# Patient Record
Sex: Male | Born: 1935 | Race: Black or African American | Hispanic: No | Smoking: Former smoker
Health system: Southern US, Community
[De-identification: ages and names within clinical notes are randomized; demographics above are authoritative.]

## PROBLEM LIST (undated history)

## (undated) DIAGNOSIS — H409 Unspecified glaucoma: Secondary | ICD-10-CM

## (undated) DIAGNOSIS — I10 Essential (primary) hypertension: Secondary | ICD-10-CM

## (undated) DIAGNOSIS — Z9289 Personal history of other medical treatment: Secondary | ICD-10-CM

## (undated) DIAGNOSIS — H269 Unspecified cataract: Secondary | ICD-10-CM

## (undated) DIAGNOSIS — M109 Gout, unspecified: Secondary | ICD-10-CM

## (undated) DIAGNOSIS — E785 Hyperlipidemia, unspecified: Secondary | ICD-10-CM

## (undated) DIAGNOSIS — M1990 Unspecified osteoarthritis, unspecified site: Secondary | ICD-10-CM

## (undated) HISTORY — DX: Hyperlipidemia, unspecified: E78.5

## (undated) HISTORY — DX: Unspecified glaucoma: H40.9

## (undated) HISTORY — PX: POLYPECTOMY: SHX149

## (undated) HISTORY — PX: COLONOSCOPY W/ POLYPECTOMY: SHX1380

## (undated) HISTORY — PX: COLONOSCOPY: SHX174

## (undated) HISTORY — DX: Unspecified cataract: H26.9

## (undated) NOTE — *Deleted (*Deleted)
[facility] Discharge Summary  Patient name: Matthew Winters Medical record number: [id number] Date of birth: [birth year] Age: 40 y.o. Gender: male Date of Admission: 09/22/2020  Date of Discharge: 10/***/2021 Admitting Physician: Shary Key, DO  Primary Care Provider: [name], MD Consultants: Palliative Care, Nephrology  Indication for Hospitalization: AMS, Fall  Discharge Diagnoses/Problem List:  AMS s/p fall. Meeting SIRS criteria Acute kidney failure Hyperkalemia AG metabolic acidosis Acute Infarct   Disposition: To Hospice  Discharge Condition: ***  Discharge Exam: ***  Brief Hospital Course:  AMS s/p fall. Meeting SIRS criteria: On admission patient was hypothermic at < 93 and had a leukocytosis meeting SIRS criteria. At this point patient was anuric and so Nephrology was consulted and recommended giving fluids and antibiotics until family could have a goals of care discussion. He received 3.5 L IVF bolus and was placed on NS 125 cc/hr. On 10/10 was transitioned to full comfort care and all further workup was stopped.   Acute kidney failure: On admission baseline kidney function was unknown but had a Creatinine of 8.8, BUN 149, GFR of 5, and was anuric. Patient was a poor candidate for dialysis so was started on NS 125 cc/hr. On 10/10 was transitioned to full comfort care and all further workup was stopped.   Hyperkalemia: On admission patient was found to have a K of 5.6. He was unable to take oral binding agents. Was monitored until 10/10 when pateitn was transitioned to full comfort care and all further workup was stopped.   AG metabolic acidosis: On admission patient was found to have an anion gap of 21. Was monitored until 10/10 when pateitn was transitioned to full comfort care and all further workup was stopped.   Acute Infarct: On admission CT showed hyperdense vessel in L MCA, chronic diffuse atrophy, small vessel ischemic  change. Given overall prognosis shared decision making was done with patients daughter and MRI was declined. Was monitored until 10/10 when pateitn was transitioned to full comfort care and all further workup was stopped.   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  Followed by Dr. Venetia Maxon at [facility] medication include combigan, rhopressa, and restasis, and lumigan eye drops. Was monitored until 10/10 when pateitn was transitioned to full comfort care and all further workup was stopped.   Issues for Follow Up:  1. AMS s/p fall. Meeting SIRS criteria: Has transitioned to comfort care and so no further workup will be done.   2. Acute kidney failure: Has transitioned to comfort care and so no further labs or workup will be done.   3. Hyperkalemia: Has transitioned to comfort care and so no further labs or workup will be done.   4. AG metabolic acidosis: Has transitioned to comfort care and so no further labs or workup will be done.   5. Acute Infarct: Has transitioned to comfort care and so no further labs or workup will be done.   6. Cataracts and Glaucoma: Has transitioned to comfort care and so no further labs or workup will be done.    Significant Procedures: None  Significant Labs and Imaging:  Recent Labs  Lab 09/28/2020 0954 09/28/2020 1009 09/08/20 0044  WBC 11.8*  --  12.2*  HGB 14.5 16.0 12.9*  HCT 44.3 47.0 40.1  PLT 137*  --  112*   Recent Labs  Lab 09/17/2020 0954 09/29/2020 0954 09/11/2020 1009 08/31/2020 1009 08/31/2020 2201 09/24/2020 2201 09/08/20 0044 09/08/20 0918  NA 147*  --  144  --  147*  --  148* 149*  K 5.9*   < > 5.6*   < > 5.8*   < > 5.6* 5.8*  CL 107  --  114*  --  116*  --  115* 117*  CO2 14*  --   --   --  12*  --  12* 10*  GLUCOSE 145*  --  144*  --  117*  --  124* 118*  BUN 149*  --   >130*  --  150*  --  149* 146*  CREATININE 8.80*  --  9.20*  --  7.55*  --  7.39* 7.16*  CALCIUM 9.9  --   --   --  8.1*  --  8.3* 8.1*  ALKPHOS 52  --   --   --   --   --  43 47  AST 113*  --   --   --   --   --  76* 61*  ALT 143*  --   --   --   --   --  110* 102*  ALBUMIN 3.9  --   --   --   --   --  3.0* 3.1*   < > = values in this interval not displayed.    US Renal: No hydronephrosis. Echogenic kidneys bilaterally which can be seen in patients with medical renal disease. Bilateral renal cysts measuring up to 10.9 cm on the left.   DG Pelvis Portable: No acute osseous abnormality.   DG Chest Port 1 View: Low lung volume exam with bibasilar opacities favored to represent atelectasis. Infection not excluded.   CT Head WO Contrast: see results below   CT Cervical Spine Wo Contrast: Question hyperdense vessel in the left middle cerebral artery. Recommend MRI of the head for further evaluation to exclude acute infarct. No focal acute fracture or dislocation of cervical spine. Chronic diffuse atrophy and chronic bilateral periventricular white matter small vessel ischemic change.   Results/Tests Pending at Time of Discharge: None  Discharge Medications:  Allergies as of 09/09/2020   No Known Allergies   Med Rec must be completed prior to using this [facility]***       Discharge Instructions: Please refer to Patient Instructions section of EMR for full details.  Patient was counseled important signs and symptoms that should prompt return to medical care, changes in medications, dietary instructions, activity restrictions, and follow up appointments.   Follow-Up Appointments:   Briant Cedar, MD 09/09/2020, 11:04 AM PGY-1, [place]

---

## 1998-09-26 ENCOUNTER — Emergency Department (HOSPITAL_COMMUNITY): Admission: EM | Admit: 1998-09-26 | Discharge: 1998-09-26 | Payer: Self-pay | Admitting: Emergency Medicine

## 2001-08-07 ENCOUNTER — Emergency Department: Admission: EM | Admit: 2001-08-07 | Discharge: 2001-08-07 | Payer: Self-pay | Admitting: Emergency Medicine

## 2001-08-07 ENCOUNTER — Encounter: Payer: Self-pay | Admitting: Emergency Medicine

## 2003-03-29 ENCOUNTER — Inpatient Hospital Stay (HOSPITAL_COMMUNITY): Admission: EM | Admit: 2003-03-29 | Discharge: 2003-04-03 | Payer: Self-pay | Admitting: Emergency Medicine

## 2003-03-29 ENCOUNTER — Encounter: Payer: Self-pay | Admitting: Emergency Medicine

## 2003-03-30 ENCOUNTER — Encounter: Payer: Self-pay | Admitting: Cardiovascular Disease

## 2003-03-30 ENCOUNTER — Encounter (INDEPENDENT_AMBULATORY_CARE_PROVIDER_SITE_OTHER): Payer: Self-pay | Admitting: Specialist

## 2003-03-31 ENCOUNTER — Encounter: Payer: Self-pay | Admitting: Internal Medicine

## 2004-06-01 ENCOUNTER — Emergency Department (HOSPITAL_COMMUNITY): Admission: EM | Admit: 2004-06-01 | Discharge: 2004-06-01 | Payer: Self-pay | Admitting: Emergency Medicine

## 2005-03-20 ENCOUNTER — Emergency Department (HOSPITAL_COMMUNITY): Admission: EM | Admit: 2005-03-20 | Discharge: 2005-03-20 | Payer: Self-pay | Admitting: Emergency Medicine

## 2005-03-30 ENCOUNTER — Ambulatory Visit: Payer: Self-pay | Admitting: Gastroenterology

## 2005-04-07 ENCOUNTER — Ambulatory Visit: Payer: Self-pay | Admitting: Gastroenterology

## 2005-04-22 ENCOUNTER — Encounter (INDEPENDENT_AMBULATORY_CARE_PROVIDER_SITE_OTHER): Payer: Self-pay | Admitting: *Deleted

## 2005-04-22 ENCOUNTER — Ambulatory Visit: Payer: Self-pay | Admitting: Gastroenterology

## 2005-09-01 ENCOUNTER — Emergency Department (HOSPITAL_COMMUNITY): Admission: EM | Admit: 2005-09-01 | Discharge: 2005-09-01 | Payer: Self-pay | Admitting: Emergency Medicine

## 2005-10-14 ENCOUNTER — Emergency Department (HOSPITAL_COMMUNITY): Admission: EM | Admit: 2005-10-14 | Discharge: 2005-10-14 | Payer: Self-pay | Admitting: Emergency Medicine

## 2008-02-26 ENCOUNTER — Emergency Department (HOSPITAL_COMMUNITY): Admission: EM | Admit: 2008-02-26 | Discharge: 2008-02-26 | Payer: Self-pay | Admitting: Emergency Medicine

## 2008-12-16 ENCOUNTER — Emergency Department (HOSPITAL_COMMUNITY): Admission: EM | Admit: 2008-12-16 | Discharge: 2008-12-16 | Payer: Self-pay | Admitting: Emergency Medicine

## 2010-03-28 ENCOUNTER — Encounter (INDEPENDENT_AMBULATORY_CARE_PROVIDER_SITE_OTHER): Payer: Self-pay | Admitting: *Deleted

## 2010-05-30 ENCOUNTER — Encounter (INDEPENDENT_AMBULATORY_CARE_PROVIDER_SITE_OTHER): Payer: Self-pay | Admitting: *Deleted

## 2010-06-03 ENCOUNTER — Ambulatory Visit: Payer: Self-pay | Admitting: Gastroenterology

## 2010-06-12 ENCOUNTER — Ambulatory Visit: Payer: Self-pay | Admitting: Gastroenterology

## 2010-11-30 HISTORY — PX: EYE SURGERY: SHX253

## 2010-12-30 NOTE — Miscellaneous (Signed)
Summary: [facility] Previsit/prep  Clinical Lists Changes  Medications: Added new medication of MOVIPREP 100 GM  SOLR (PEG-KCL-NACL-NASULF-NA ASC-C) As per prep instructions. - Signed Rx of MOVIPREP 100 GM  SOLR (PEG-KCL-NACL-NASULF-NA ASC-C) As per prep instructions.;  #1 x 0;  Signed;  Entered by: Emerson Monte RN;  Authorized by: Ladene Artist MD [facility];  Method used: Electronically to [id number] [address], [place], [place], [place]  [address], Ph: [phone], Fax: [phone] Observations: Added new observation of NKA: T (06/03/2010 11:02)    Prescriptions: MOVIPREP 100 GM  SOLR (PEG-KCL-NACL-NASULF-NA ASC-C) As per prep instructions.  #1 x 0   Entered by:   Emerson Monte RN   Authorized by:   Ladene Artist MD [facility]   Signed by:   Emerson Monte RN on 06/03/2010   Method used:   Electronically to        [id number] (retail)       [address] [place], [place]  [address]       Ph: [phone]       Fax: [phone]   RxID:   [id number]

## 2010-12-30 NOTE — Letter (Signed)
Summary: [facility] Instructions  [facility]  [place], [place] [address]   Phone: [phone]  Fax: [phone]       DEVONE BATT    October 30, 1950    MRN: [id number]        Procedure Day /Date:  06/12/10  Thursday     Arrival Time:  2pm      Procedure Time: 3pm     Location of Procedure:                    _ x_  [facility] (4th Floor)                        Ensign   Starting 5 days prior to your procedure _ 7/9/11_ do not eat nuts, seeds, popcorn, corn, beans, peas,  salads, or any raw vegetables.  Do not take any fiber supplements (e.g. Metamucil, Citrucel, and Benefiber).  THE DAY BEFORE YOUR PROCEDURE         DATE:   06/11/10  DAY: Wednesday  1.  Drink clear liquids the entire day-NO SOLID FOOD  2.  Do not drink anything colored red or purple.  Avoid juices with pulp.  No orange juice.  3.  Drink at least 64 oz. (8 glasses) of fluid/clear liquids during the day to prevent dehydration and help the prep work efficiently.  CLEAR LIQUIDS INCLUDE: Water Jello Ice Popsicles Tea (sugar ok, no milk/cream) Powdered fruit flavored drinks Coffee (sugar ok, no milk/cream) Gatorade Juice: apple, white grape, white cranberry  Lemonade Clear bullion, consomm, broth Carbonated beverages (any kind) Strained chicken noodle soup Hard Candy                             4.  In the morning, mix first dose of MoviPrep solution:    Empty 1 Pouch A and 1 Pouch B into the disposable container    Add lukewarm drinking water to the top line of the container. Mix to dissolve    Refrigerate (mixed solution should be used within 24 hrs)  5.  Begin drinking the prep at 5:00 p.m. The MoviPrep container is divided by 4 marks.   Every 15 minutes drink the solution down to the next mark (approximately 8 oz) until the full liter is complete.   6.  Follow completed prep with 16 oz of clear liquid of your choice (Nothing  red or purple).  Continue to drink clear liquids until bedtime.  7.  Before going to bed, mix second dose of MoviPrep solution:    Empty 1 Pouch A and 1 Pouch B into the disposable container    Add lukewarm drinking water to the top line of the container. Mix to dissolve    Refrigerate  THE DAY OF YOUR PROCEDURE      DATE:  06/12/10  DAY:  Thursday  Beginning at 10:00 a.m. (5 hours before procedure):         1. Every 15 minutes, drink the solution down to the next mark (approx 8 oz) until the full liter is complete.  2. Follow completed prep with 16 oz. of clear liquid of your choice.    3. You may drink clear liquids until  1pm  (2 HOURS BEFORE PROCEDURE).   MEDICATION INSTRUCTIONS  Unless otherwise instructed, you should take regular prescription medications with a small sip of water  as early as possible the morning of your procedure.        OTHER INSTRUCTIONS  You will need a responsible adult at least 73 years of age to accompany you and drive you home.   This person must remain in the waiting room during your procedure.  Wear loose fitting clothing that is easily removed.  Leave jewelry and other valuables at home.  However, you may wish to bring a book to read or  an iPod/MP3 player to listen to music as you wait for your procedure to start.  Remove all body piercing jewelry and leave at home.  Total time from sign-in until discharge is approximately 2-3 hours.  You should go home directly after your procedure and rest.  You can resume normal activities the  day after your procedure.  The day of your procedure you should not:   Drive   Make legal decisions   Operate machinery   Drink alcohol   Return to work  You will receive specific instructions about eating, activities and medications before you leave.    The above instructions have been reviewed and explained to me by  Emerson Monte RN  June 03, 2010 11:47 AM     I fully understand and  can verbalize these instructions _____________________________ Date _________

## 2010-12-30 NOTE — Procedures (Signed)
Summary: Colonoscopy  Patient: Matthew Winters Note: All result statuses are Final unless otherwise noted.  Tests: (1) Colonoscopy (COL)   COL Colonoscopy           [facility]     [place], [place]  [address]           COLONOSCOPY PROCEDURE REPORT     PATIENT:  Matthew, Winters  MR#:  [place]:[id number]     BIRTHDATE:  December 13, 1935, 74 yrs. old  GENDER:  male     ENDOSCOPIST:  Norberto Sorenson T. Fuller Plan, MD, [facility]           PROCEDURE DATE:  06/12/2010     PROCEDURE:  Colonoscopy [id number]     ASA CLASS:  Class II     INDICATIONS:  1) surveillance and high-risk screening  2)     follow-up of polyp, adenomatous polyps: 03/2003, 03/2005.     MEDICATIONS:   Fentanyl 50 mcg IV, Versed 6 mg IV     DESCRIPTION OF PROCEDURE:   After the risks benefits and     alternatives of the procedure were thoroughly explained, informed     consent was obtained.  Digital rectal exam was performed and     revealed no abnormalities.   The LB PCF-Q180AL [id number] endoscope     was introduced through the anus and advanced to the cecum, which     was identified by both the appendix and ileocecal valve, without     limitations.  The quality of the prep was excellent, using     MoviPrep.  The instrument was then slowly withdrawn as the colon     was fully examined.     <<PROCEDUREIMAGES>>     FINDINGS:  Mild diverticulosis was found in the sigmoid colon.  A     normal appearing cecum, ileocecal valve, and appendiceal orifice     were identified. The ascending, hepatic flexure, transverse,     splenic flexure, descending colon, and rectum appeared     unremarkable. Retroflexed views in the rectum revealed internal     hemorrhoids, small. The time to cecum =  1.67  minutes. The scope     was then withdrawn (time =  8  min) from the patient and the     procedure completed.     COMPLICATIONS:  None           ENDOSCOPIC IMPRESSION:     1) Mild diverticulosis in the sigmoid colon     2) Internal hemorrhoids   RECOMMENDATIONS:     1) High fiber diet with liberal fluid intake.     2) Repeat Colonoscopy in 5 years.           Pricilla Riffle. Fuller Plan, MD, [facility]           CC: Jana Hakim, MD           n.     Lorrin MaisPricilla Riffle. Stark at 06/12/2010 03:12 PM           [name], [place]:[id number]  Note: An exclamation mark (!) indicates a result that was not dispersed into the flowsheet. Document Creation Date: 06/12/2010 3:14 PM _______________________________________________________________________  (1) Order result status: Final Collection or observation date-time: 06/12/2010 15:09 Requested date-time:  Receipt date-time:  Reported date-time:  Referring Physician:   Ordering Physician: Lucio Edward [phone]) Specimen Source:  Source: Tawanna Cooler Order Number: [id number] Lab site:  Appended Document: Colonoscopy     Procedures Next Due Date:    Colonoscopy: 05/2015

## 2010-12-30 NOTE — Letter (Signed)
Summary: Colonoscopy Letter  [facility]  [place], [place] [address]   Phone: [phone]  Fax: [phone]      March 28, 2010 MRN: [id number]   [facility] [address] [place], [place]  [address]   Dear Matthew Winters,   According to your medical record, it is time for you to schedule a Colonoscopy. The American Cancer Society recommends this procedure as a method to detect early colon cancer. Patients with a family history of colon cancer, or a personal history of colon polyps or inflammatory bowel disease are at increased risk.  This letter has beeen generated based on the recommendations made at the time of your procedure. If you feel that in your particular situation this may no longer apply, please contact our office.  Please call our office at [phone] to schedule this appointment or to update your records at your earliest convenience.  Thank you for cooperating with [place] to provide you with the very best care possible.   Sincerely,  Norberto Sorenson T. Fuller Plan, M.D.  [facility] [phone]

## 2011-01-16 ENCOUNTER — Other Ambulatory Visit (HOSPITAL_COMMUNITY): Payer: Self-pay | Admitting: Ophthalmology

## 2011-01-16 ENCOUNTER — Encounter (HOSPITAL_COMMUNITY)
Admission: RE | Admit: 2011-01-16 | Discharge: 2011-01-16 | Disposition: A | Discharge status: Home / Self Care | Payer: Medicare Other | Source: Ambulatory Visit | Attending: Ophthalmology | Admitting: Ophthalmology

## 2011-01-16 ENCOUNTER — Ambulatory Visit (HOSPITAL_COMMUNITY)
Admission: RE | Admit: 2011-01-16 | Discharge: 2011-01-16 | Disposition: A | Discharge status: Home / Self Care | Payer: Medicare Other | Source: Ambulatory Visit | Attending: Ophthalmology | Admitting: Ophthalmology

## 2011-01-16 DIAGNOSIS — Z01811 Encounter for preprocedural respiratory examination: Secondary | ICD-10-CM

## 2011-01-16 DIAGNOSIS — H409 Unspecified glaucoma: Secondary | ICD-10-CM | POA: Insufficient documentation

## 2011-01-16 DIAGNOSIS — Z01818 Encounter for other preprocedural examination: Secondary | ICD-10-CM | POA: Insufficient documentation

## 2011-01-16 LAB — CBC
MCV: 82.7 fL (ref 78.0–100.0)
Platelets: 303 10*3/uL (ref 150–400)
RBC: 4.45 MIL/uL (ref 4.22–5.81)
WBC: 5.2 10*3/uL (ref 4.0–10.5)

## 2011-01-21 ENCOUNTER — Ambulatory Visit (HOSPITAL_COMMUNITY)
Admission: RE | Admit: 2011-01-21 | Discharge: 2011-01-21 | Disposition: A | Discharge status: Home / Self Care | Payer: Medicare Other | Source: Ambulatory Visit | Attending: Ophthalmology | Admitting: Ophthalmology

## 2011-01-21 DIAGNOSIS — Z87891 Personal history of nicotine dependence: Secondary | ICD-10-CM | POA: Insufficient documentation

## 2011-01-21 DIAGNOSIS — H4011X0 Primary open-angle glaucoma, stage unspecified: Secondary | ICD-10-CM | POA: Insufficient documentation

## 2011-03-02 NOTE — Op Note (Signed)
NAMEMURVIN, DUGGAN NO.:  [id number]  MEDICAL RECORD NO.:  [id number]           PATIENT TYPE:  O  LOCATION:  XRAY                         FACILITY:  [facility]  PHYSICIAN:  Marylynn Pearson, M.D.     DATE OF BIRTH:  Feb 03, 1936  DATE OF PROCEDURE:  01/21/2011 DATE OF DISCHARGE:  01/16/2011                              OPERATIVE REPORT   PREOPERATIVE DIAGNOSIS:  Uncontrolled primary open-angle glaucoma despite maximum tolerated medical therapy and laser treatment.  POSTOPERATIVE DIAGNOSIS:  Uncontrolled primary open-angle glaucoma despite maximum tolerated medical therapy and laser treatment.  PROCEDURE:  Trabeculectomy with mitomycin C.  ANESTHESIA:  Consisted of 2% Xylocaine with epinephrine 50/50 mixture with 0.75% Marcaine with ampule of Wydase.  COMPLICATIONS:  None.  PROCEDURE:  The patient was given a peribulbar block with the aforementioned local anesthetic agent under monitored anesthesia in the operative suite, pressure was applied to the eye.  The operating microscope was put into position and a 6-0 nylon suture was passed through clear cornea to infraduct the eye.  Following this using a Hoskins forceps, the conjunctiva was grasped with the limbus and a Westcott scissors were used to make an incision at the limbus.  A blunt Westcott suture was used to form a limbal based conjunctival flap spreading posteriorly.  The tip blade was used to recess Tenon fibers and bleeding was controlled with cautery.  A 45-degree blade was used to make a half-thickness scleral flap.  The Colibri forceps were then used to slightly elevate the flap and a Grieshaber blade was used to dissect up to the corneoscleral limbus.  Bleeding was controlled with cautery, mitomycin C 0.4 mg/mL was placed on a Gelfoam sponge placed under the conjunctiva and allowed to stay on the eye for 3 minutes and removed, and irrigated with 50 mL of balanced salt solution.  Following this, the  paracentesis tract was actually formed at the 9:30 position into clear cornea.  The cannula was placed to prove patency of the tract.  The scleral flap was elevated, a 45-degree blade was used to enter the anterior chamber.  A 1 x 3 block of tissue was removed with Descemet punch.  Sclera was sutured with four interrupted 10-0 nylon sutures.  BSS was used to deepen the anterior chamber.  A basilar peripheral iridectomy was performed after the scleral tissue had been removed.  There was no bleeding, and the chamber was deepened.  It was noted since the eye was felt firm and there was no leakage around the scleral flap.  Therefore, one of the 10-0 nylon sutures was removed. The eye was then repressurized and there was just a slight leakage noted with the eye having a low-pressure, therefore a 9-0 nylon BV 100 needle was used to suture the conjunctiva at the limbus, watertight closure was achieved.  The chamber was deepened.  There being no leakage with 2% fluorescein staining.  A subconjunctival injection of Kenalog 4 mg was given in the inferior nasal quadrant.  Topical atropine ointment and TobraDex ointment were applied to the eye.  A patch and Fox shield were  placed, and the patient returned to recovery area in stable condition.     Marylynn Pearson, M.D.     RW/MEDQ  D:  01/21/2011  T:  01/22/2011  Job:  [id number]  Electronically Signed by Marylynn Pearson M.D. on 03/02/2011 02:02:57 PM

## 2011-04-17 NOTE — Cardiovascular Report (Signed)
   NAMEELEX, MONTAGUE NO.:  [id number]   MEDICAL RECORD NO.:  [id number]                   PATIENT TYPE:  INP   LOCATION:  [id number]                                 FACILITY:  [facility]   PHYSICIAN:  Minus Breeding, M.D.                DATE OF BIRTH:  1936/05/05   DATE OF PROCEDURE:  04/02/2003  DATE OF DISCHARGE:                              CARDIAC CATHETERIZATION   PROCEDURE:  Left heart catheterization/coronary arteriography.   INDICATIONS:  Evaluate patient with chest pain suggestive of unstable  angina.   DESCRIPTION OF PROCEDURE:  Left heart catheterization performed via the  right femoral artery.  The artery was cannulated using an anterior wall  puncture.  A #6 French arterial sheath was inserted via the modified  Seldinger technique.  Preformed Judkins and a pigtail catheter were  utilized.  The patient tolerated the procedure well and left the lab in  stable condition.   RESULTS:   HEMODYNAMICS:  1. LV:  160/28.  2. AO:  159/86.   CORONARIES:  1. The left main had distal calcifications.  2. The LAD had proximal long 30% stenosis with calcifications.  There were     mid- and distal diffuse luminal irregularities.  3. The circumflex in the AV groove was small and normal.  There were luminal     irregularities.  There was a very large ramus intermediate which was a     trifurcating vessel.  It had a proximal focal 25% stenosis.  The OM-1 was     moderate-sized with proximal 30% stenosis.  4. The right coronary artery was dominant.  There was proximal 40% stenosis,     followed by mid-40% stenosis.  There were diffuse luminal irregularities.   LEFT VENTRICULOGRAM:  The left ventriculogram was obtained in the RAO  projection.  The EF was 60% with mild inferohypokinesis.   CONCLUSION:  1. Nonobstructive coronary disease.  2. Well-preserved ejection fraction.    PLAN:  The patient will be managed medically for his nonobstructive coronary  disease.  He should have aggressive secondary risk reduction.  No further  cardiovascular testing is suggested.                                                Minus Breeding, M.D.    JH/MEDQ  D:  04/02/2003  T:  04/02/2003  Job:  [id number]   cc:   Pricilla Riffle. Fuller Plan, M.D. [facility]

## 2011-04-17 NOTE — H&P (Signed)
NAME:  Matthew Winters, Matthew Winters NO.:  [id number]   MEDICAL RECORD NO.:  [id number]                   PATIENT TYPE:  INP   LOCATION:  1827                                 FACILITY:  [facility]   PHYSICIAN:  Fay Records, M.D.                 DATE OF BIRTH:  1936/08/08   DATE OF ADMISSION:  03/29/2003  DATE OF DISCHARGE:                                HISTORY & PHYSICAL   IDENTIFICATION:  The patient is a 75 year old gentleman with no prior  cardiac history, who presented today to Dr. Norberto Sorenson T. Stark's office for  evaluation of hematochezia and shortness of breath.   HISTORY OF PRESENT ILLNESS:  The patient notes over the past few weeks the  onset of exertional chest pressure.  He changes tires often and the pain and  shortness of breath occur with this.  It eases off when he slows down.  He  notes no chest pressure at rest.  No PND.  Note, he had bright red blood per  rectum a few weeks ago.   ALLERGIES:  None.   MEDICATIONS:  1. Alcon eyedrops t.i.d.  2. Betimol eyedrops.  3. Travatan eyedrops.   PAST MEDICAL HISTORY:  Eye problems.   SOCIAL HISTORY:  The patient is widowed.  He quit smoking 10 years ago about  a 10-pack year.  He does not drink for the past 15 years.   FAMILY HISTORY:  His mother is age 76 with no heart problems.  His father  died of cancer.  No siblings with CAD.   REVIEW OF SYSTEMS:  Overall have reviewed and negative to the above  problems, except as noted.   PHYSICAL EXAMINATION:  GENERAL APPEARANCE:  The patient is in no acute  distress.  VITAL SIGNS:  Blood pressure 162/90, pulse 94, temperature 98.3 degrees.  WEIGHT:  Not taken.  NECK:  JVP is normal.  No bruits.  LUNGS:  Clear.  CARDIAC:  Regular rate and rhythm.  Normal S1 and S2.  No S3, S4, or  murmurs.  ABDOMEN:  No hepatomegaly.  No bruits.  EXTREMITIES:  2+ pulses.  No lower extremity edema.  No bruits.  LUNGS:  Clear to auscultation.   LABORATORY DATA:  The  12-lead EKG showed normal sinus rhythm and rate of 81  beats per minute.   Significant for a hemoglobin of 5.9 with an MCV of 74, WBC 5.9, and  platelets 684.  CK-MB of 96 and 0.9 and troponin 0.01.    IMPRESSION:  The patient is a 75 year old with a history of progressive  angina pectoris.  No episodes at rest.  He presented to the emergency room.  EKG negative.  Exam unremarkable.  Labs are significant for severe anemia.  Will go ahead and transfuse the patient and ask GI to come and follow along  with the patient.  Will continue  to monitor him on telemetry.  Once the GI  evaluation is done, consider ischemic evaluation (possible Cardiolite).                                               Fay Records, M.D.    PVR/MEDQ  D:  03/29/2003  T:  03/29/2003  Job:  [id number]

## 2011-04-17 NOTE — Discharge Summary (Signed)
Matthew Winters, Matthew Winters NO.:  [id number]   MEDICAL RECORD NO.:  [id number]                   PATIENT TYPE:  INP   LOCATION:  [id number]                                 FACILITY:  [facility]   PHYSICIAN:  Fay Records, M.D.                 DATE OF BIRTH:  [birth year]   DATE OF ADMISSION:  03/29/2003  DATE OF DISCHARGE:  04/03/2003                           DISCHARGE SUMMARY - REFERRING   PROCEDURES:  1. Coronary angiogram Apr 02, 2003.  2. Esophagogastroduodenoscopy and colonoscopy March 30, 2003.  3. Small bowel follow through Mar 31, 2003.   REASON FOR ADMISSION:  The patient is a 75 year old male with no prior  history of heart disease who was referred to the emergency room by Dr.  Lucio Edward for evaluation of chest pain.  The patient presented with a  recent history of new onset of hematochezia and a three week history of  exertional chest discomfort relieved with rest.  Please refer to dictated  admission note for full details.   LABORATORY DATA:  Cardiac enzymes:  Serial CPK-MB and troponin-I markers  were normal.  Lipid profile with cholesterol total 191, triglycerides 117,  HDL 47, LDL 121 (ratio 4.1).  TSH 2.4.  Iron profile with total iron less  than 10, TIBC and percent saturated not calculated.  Ferritin low at 12.  White blood cells 5.9, hemoglobin 5.9 hematocrit 19.9, MCV 74.3, platelet  count 684,000 on admission, hemoglobin 10.5, hematocrit 33.2 at discharge.  Normal complete metabolic panel.  Percent reticulocyte normal, 2.2.   Admission chest x-ray with mild cardiomegaly, no active disease.   HOSPITAL COURSE:  Following presentation to the emergency room, the patient  was noted to have an initial  hemoglobin level of 5.9.  He was treated with  two units of packed red blood cells with initial improvement in hemoglobin  level to 8.6.   Following hospitalization the patient had no recurrent complaint of chest  pain.  Serial cardiac markers  were all within normal limits.  The patient  was referred to Dr. Lucio Edward for a gastrointestinal evaluation and  underwent subsequent esophagogastroduodenoscopy revealing duodenitis without  hemorrhage and angiodysplasia of the stomach and duodenum.  A colonoscopy  revealed polyps and a normal small bowel follow through.  Stools for guaiac  were negative.  Blood work was notable for profound iron deficiency anemia  (total iron level less than 10).  The patient was started on oral iron  supplementation prior to discharge with recommendations to take OTC Prilosec  as an outpatient.  It was felt that if the patient were to exhibit  persistent anemia and developed heme positive stools, then recommendations  would be to proceed with a capsule endoscopy.   The patient then underwent cardiac work up for exclusion of significant  underlying coronary artery disease.  An initial echocardiogram revealed mild  LVD (  40-50%) with septal and periapical wall hypokinesis.  Thus, coronary  angiogram was recommended.   Cardiac catheterization was performed by Dr. Minus Breeding on Apr 02, 2003  (see report for full details), revealed nonobstructive coronary artery  disease with well preserved ejection fraction (60%) with mild inferior  hypokinesis.  Specifically, there was a 30% left anterior descending lesion;  25% proximal ramus intermedius, 30% proximal obtuse marginal #1 and 40%  proximal and mid right coronary artery disease.   The patient was kept for overnight observation with recommendations to  continue medical management and secondary prevention.  The patient was  started on low dose aspirin, beta blocker, Altace and Zocor on admission.  These will be continued at discharge with recommended follow up liver/lipid  profile in approximately 3 months.   DISCHARGE MEDICATIONS:  1. Prilosec (OTC) one tablet daily.  2. Iron sulfate 325 mg t.i.d.  3. Multivitamin one tablet daily.  4. Coated  aspirin 81 mg daily.  5. Lopressor 25 mg b.i.d.  6. Altace 2.5 mg daily.  7. Zocor 20 mg q.h.s.  8. Nitrostat 0.4 mg PRN.   DISCHARGE INSTRUCTIONS:   ACTIVITY:  No heavy lifting, driving X2 days.   DIET:  Maintain low fat, low cholesterol diet.   WOUND CARE:  Call the office if there is any swelling or bleeding of the  groin.   FOLLOW UP:  The patient is scheduled to follow up with Dr. Lucio Edward of  [facility] on May 07, 2003 at 8:45 A.M.  The patient is  recommended to return to see Dr. Dorris Carnes for continued cardiac follow up  in the ensuing 2-3 months.   DISCHARGE DIAGNOSES:  1. Nonobstructive coronary artery disease.     a. Chest pain precipitated by profound iron deficiency anemia.     b. Coronary angiogram Apr 02, 2003.  2. Iron deficiency anemia.     a. Status post four units packed red blood cells.  3. Dyslipidemia.  4. History of hypertension.  5. Remote tobacco.     Gene Serpe, P.A. [facility]                      Fay Records, M.D.    GS/MEDQ  D:  04/03/2003  T:  04/03/2003  Job:  [id number]   cc:   Pricilla Riffle. Fuller Plan, M.D. [facility]

## 2011-06-25 ENCOUNTER — Other Ambulatory Visit: Payer: Self-pay | Admitting: Nephrology

## 2011-06-25 ENCOUNTER — Ambulatory Visit
Admission: RE | Admit: 2011-06-25 | Discharge: 2011-06-25 | Disposition: A | Discharge status: Home / Self Care | Payer: Medicare Other | Source: Ambulatory Visit | Attending: Nephrology | Admitting: Nephrology

## 2011-06-25 DIAGNOSIS — N183 Chronic kidney disease, stage 3 unspecified: Secondary | ICD-10-CM

## 2011-08-24 LAB — URINALYSIS, ROUTINE W REFLEX MICROSCOPIC
Glucose, UA: NEGATIVE
Protein, ur: 100 — AB
Urobilinogen, UA: 0.2

## 2011-08-24 LAB — CBC
HCT: 37.3 — ABNORMAL LOW
MCHC: 32.5
MCV: 81.8
Platelets: 286
RDW: 15

## 2011-08-24 LAB — POCT I-STAT, CHEM 8
Hemoglobin: 13.6
Sodium: 141
TCO2: 23

## 2011-08-24 LAB — DIFFERENTIAL
Basophils Absolute: 0
Basophils Relative: 0
Eosinophils Absolute: 0.2
Eosinophils Relative: 2

## 2011-08-24 LAB — URINE MICROSCOPIC-ADD ON

## 2012-07-12 ENCOUNTER — Encounter (HOSPITAL_COMMUNITY): Payer: Self-pay | Admitting: Pharmacy Technician

## 2012-07-20 ENCOUNTER — Encounter (HOSPITAL_COMMUNITY)
Admission: RE | Admit: 2012-07-20 | Discharge: 2012-07-20 | Disposition: A | Discharge status: Home / Self Care | Payer: Medicare Other | Source: Ambulatory Visit | Attending: Ophthalmology | Admitting: Ophthalmology

## 2012-07-20 ENCOUNTER — Other Ambulatory Visit: Payer: Self-pay | Admitting: Ophthalmology

## 2012-07-20 ENCOUNTER — Encounter (HOSPITAL_COMMUNITY): Payer: Self-pay

## 2012-07-20 HISTORY — DX: Unspecified osteoarthritis, unspecified site: M19.90

## 2012-07-20 HISTORY — DX: Gout, unspecified: M10.9

## 2012-07-20 HISTORY — DX: Personal history of other medical treatment: Z92.89

## 2012-07-20 HISTORY — DX: Essential (primary) hypertension: I10

## 2012-07-20 LAB — BASIC METABOLIC PANEL
BUN: 23 mg/dL (ref 6–23)
Calcium: 9.7 mg/dL (ref 8.4–10.5)
GFR calc Af Amer: 33 mL/min — ABNORMAL LOW (ref >90)
GFR calc non Af Amer: 28 mL/min — ABNORMAL LOW (ref >90)
Potassium: 4.8 mEq/L (ref 3.5–5.1)
Sodium: 141 mEq/L (ref 135–145)

## 2012-07-20 LAB — CBC
Hemoglobin: 11.7 g/dL — ABNORMAL LOW (ref 13.0–17.0)
MCHC: 32.1 g/dL (ref 30.0–36.0)
Platelets: 249 10*3/uL (ref 150–400)
RBC: 4.42 MIL/uL (ref 4.22–5.81)

## 2012-07-20 NOTE — Pre-Procedure Instructions (Signed)
[place]  07/20/2012   Your procedure is scheduled on:  Wednesday August 28th  Report to [place] at 1200 pm  Call this number if you have problems the morning of surgery: [phone]   Remember:   Do not eat food or drink:After Midnight.   Take these medicines the morning of surgery with A SIP OF WATER: none   Do not wear jewelry  Do not wear lotions, powders, or perfumes.   Do not shave 48 hours prior to surgery. Men may shave face and neck.  Do not bring valuables to the hospital.  Contacts, dentures or bridgework may not be worn into surgery.  Leave suitcase in the car. After surgery it may be brought to your room.  For patients admitted to the hospital, checkout time is 11:00 AM the day of discharge.   Patients discharged the day of surgery will not be allowed to drive home.   Special Instructions: CHG Shower Use Special Wash: 1/2 bottle night before surgery and 1/2 bottle morning of surgery.   Please read over the following fact sheets that you were given: Pain Booklet, Coughing and Deep Breathing and Surgical Site Infection Prevention

## 2012-07-20 NOTE — Progress Notes (Addendum)
Primary Physician - [facility] Dr Jeanie Cooks Does not have a cardiologist. Patient is a poor historian - cannot recall if had any type of cardiac work-up.  Upon arrival patient bp elevated pt stated he quit taking his blood pressure medication because it made him cough - encouraged pt to call his physician today to have a different bp medication prescribed.  Recheck bp at end of PAT visit and was 176/92 much improved from arrival bp.

## 2012-07-21 ENCOUNTER — Other Ambulatory Visit (HOSPITAL_COMMUNITY): Payer: Medicare Other

## 2012-07-25 NOTE — Consult Note (Addendum)
Anesthesia Chart Review:  Patient is a 76 year old male posted for right eye cataract extraction on 07/27/12 by Dr. Venetia Maxon.  History includes HTN, non-smoker, gout, arthritis, blood transfusion, CKD (stage III in 05/2011), s/p trabeculectomy 12/2010.  PCP is Dr. Jeanie Cooks.  He is also followed by Nephrologist Dr. Florene Glen, but has not seen him in ~ 1 year (reports his next appointment is scheduled for 08/01/12). Of note, patient's BP was elevated (~170/100) at his PAT appointment last week (patient had stopped taking his BP meds due to coughing).  I spoke with him today, and he reports that he did go and see Dr. Jeanie Cooks today for medication adjustment.  He could not tell me which BP medication was resumed, but ensures me that he is currently on an anti-hypertensive.  He was told to bring his medication bottles with him on the day of surgery.  In the meantime, I'll see if I can get records from Dr. Jeanie Cooks and Dr. Florene Glen.     EKG on 07/20/2012 showed normal sinus rhythm with sinus arrhythmia.  He had a cardiac cath in 2004 (Dr. Percival Spanish) for evaluation for chest pain that showed non-obstructive CAD (30% proximal LAD, 25% RAMUS INT, 30% OM1, 40% mid and proximal RCA) with EF 60% and mild inferohypokinesis.  Medical therapy and no further cardiac testing was recommended at that time.  Echo on 03/30/03 showed: - Overall left ventricular systolic function was mildly decreased. Left ventricular ejection fraction was estimated , range being 40 % to 50 %. hypokinesis of the septal wall. hypokinesis of the periapical wall. Left ventricular wall thickness was mildly increased. - The aortic valve was mildly calcified. - There was mild mitral valvular regurgitation. - The left atrium was mildly dilated.  CXR report from 07/20/12 showed: Cardiac silhouette is normal size and shape.  Ectasia, tortuosity, and nonaneurysmal calcification of the thoracic aorta are seen. There is flattening of the diaphragm on lateral image  with overall hyperinflation configuration consistent with element of COPD. No pulmonary infiltrates or nodules are evident. Mediastinal and hilar contours appear stable. No pleural abnormality is evident. There is moderate scoliosis convexity to the right. Osteophytes are present in the spine.  Labs from 07/20/12 showed Cr 2.17.  K 4.8.  H/H 11.7/36.5.  Currently the only comparison labs are from 02/26/08 and show a Cr of 3.6.    I'll follow-up once additional records are available.  Myra Gianotti, PA-C 07/25/12 1817  Addendum: 07/26/12 1405 PCP and Nephrology records are still pending.  I have and re-requested medical records from Dr. Santiago Bur office.  Since it is still unclear if additional records will be received prior to patient's scheduled surgery, I reviewed the available information with Anesthesiologist Dr. Tamala Julian.  His Cr is elevated, but it is actually improved since 2009.  He has known CKD and sees Dr. Florene Glen.  We'll recheck his BP on arrival.  If his BP is not significantly elevated and he has no acute CV/CHF symptoms, then plan to proceed.

## 2012-07-26 MED ORDER — KETOROLAC TROMETHAMINE 0.5 % OP SOLN
1.0000 [drp] | OPHTHALMIC | Status: AC
  Administered 2012-07-27 (×3): 1 [drp] via OPHTHALMIC
  Filled 2012-07-26: qty 5

## 2012-07-26 MED ORDER — PHENYLEPHRINE HCL 2.5 % OP SOLN
1.0000 [drp] | OPHTHALMIC | Status: AC
  Administered 2012-07-27 (×3): 1 [drp] via OPHTHALMIC
  Filled 2012-07-26: qty 3

## 2012-07-26 MED ORDER — CYCLOPENTOLATE-PHENYLEPHRINE 0.2-1 % OP SOLN
1.0000 [drp] | OPHTHALMIC | Status: AC
  Administered 2012-07-27 (×3): 1 [drp] via OPHTHALMIC
  Filled 2012-07-26: qty 2

## 2012-07-26 NOTE — H&P (Signed)
History & Physical  Name: Matthew Winters, Matthew Winters  Date:   DOB:  1935-12-28  Age:  76   CC & HPI: CHIEF COMPLAINT(S):    blurry vision OD        HPI: SYMPTOMS/RELATED:   Reports symptoms noted below    LOCATION:   Reports area of involvement as OD  dim vision interferring with TV & reading & other daily activities   QUALITY/COURSE:   Reports condition is worsening.        INTENSITY/SEVERITY:    +++++      severe       ONSET/TIMING:   Reports occurrence as 1 year ago.      CONTEXT/WHEN:  constant   MODIFIERS/TREATMENTS:  Improved by  nothing            ROS:   negative except as noted  HEART/Cardiovascular: Reports symptoms of hypertension.    Musculoskeletal (BJE): Reports symptoms of joint pain or problems.           Physical Exam  ACTIVE PROBLEMS: Glaucoma, severe stage   ICD#365.73   Nuclear cataract NOS   ICD#366.04   complex with poor dilating pupil right eye require special devices to open the pupil doing the operation.  INACTIVE PROBLEMS: Starter - Inactive Problems:  SURGERIES: Pick List - Surgeries  Trabeculectomy ab externo   ICD#12.64  O  TOBACCO: No exposure to tobacco.      Never smoker   ICD#V13.8  Tobacco use:     Tobacco cessation:       Smoker Status:     Tobacco use:     Tobacco cessation:  ALCOHOL: No alcohol consumed.  SOCIAL HISTORY: Retired.  FAMILY HISTORY: negative  ALLERGIES: Drug Allergies:     No known.    PHYSICAL EXAMINATION: Vision OD without correction 20/400 with correction 20/400 pinhole no improvement OS without correction 20/40 with correction 20/30 pinhole no improvement   Refraction OD os 1.75 combined with +1.000 axis 0 28 20/200 OS +0.25 combined with +1.00 axis 20 20/30  Intraocular pressure 20 mmHg in each eye at 11:50 AM after digital massage right eye intraocular pressure decreased to 16 mmHg  Visual field: Constricted each eye  Motility full versions  Pupils 3 mmHg nonreactive OD reactive OS negative Marcus  Gunn  Eyelids and ocular adnexa: Normal  Slit-lamp examination: Conjunctiva: Elevated  bleb OD with plus one injection, normal OS Cornea: Arcus each eye  Anterior chamber: Deep and quiet  Iris: Brown each eye, open peripheral iridectomy right eye  Lens: +3 nuclear sclerosis OD 1, 1-2 nuclear sclerosis OS  Vitreous: Clear  Dilated with phenylephrine 2.5% Mydriacyl 1%  Fundus: Poor view right eye the right eye did not dilate well the retina red reflex appears to be flat and no detail can be seen OS optic nerve with glaucomatous cupping retina flat     Exam:  BP 158/98 P 78 RESP 20  GENERAL: Appearance: General appearance can be described as well-nourished, well-developed, and in no acute distress .     LYMPHATIC:   Axillary node exam reveals no swollen or tender nodes under either arm.    Neck node findings include no adenopathy in anterior or posterior chains.    Groin-Inguinal nodes demonstrate   absence of abnormal lymph nodes .     HEAD, EARS, NOSE AND THROAT:   Ears-Nose (external) Inspection: Externally, nose and ears are normal in appearance and without scars, lesions, or nodules.  EYES:  SEE ABOVE     NECK:   Neck tissue exam demonstrates no masses, symmetrical, and trachea is midline.      Thyroid exam reveals no masses, enlargements or tenderness .     LUNGS and RESPIRATORY:   Lung auscultation elicits no wheezing, rhonci, rales or rubs and with equal breath sounds.    Respiratory effort described as breathing is unlabored and chest movement is symmetrical.    Chest Percussion demonstrates no dullness, or hyperresonance.    Chest palpation reveals   no tactile fremitus .     HEART (Cardiovascular):   Heart auscultation discovers regular rate and rhythm; no murmur, gallop or rub. Normal heart sounds.    Abdominal aorta exam reveals normal exam.    Carotid artery findings include   normal, symmetrical pulses and without bruits.    Femoral arteries  reveal normal pulse amplitudes.    Edema-Varicosity Exam: No significant peripheral edema or venous varicosities.      Pedal pulses demonstrate normal amplitudes and equal bilaterally .     ABDOMEN (Gastrointestinal):   Mass/Tenderness Exam: Neither are present.     Liver/Spleen: No hepatomegaly or splenomegaly.   Hernia checking discovers no bulging or weakness in abdominal wall.    Bowel sounds found to be   normoactive.    Anus and perineum inspection shows  DEFERRED    Rectal exam finds DEFERRED  MUSCULOSKELETAL (BJE):   Inspection-Palpation: No major bone, joint, tendon, or muscle changes.       Range of Motion:   Joints move freely and without pain. Gait and station demonstrate standing and walking are stable and functional.    Digit and Nails: Fingers-fingernails and toes-toenails are unremarkable.      NEUROLOGICAL: Alert and oriented. No major deficits of coordination or sensation. Cranial Nerves: No defects involving cranial nerves II-XII.      Reflexes: No deep tendon reflex deficits, Babinski is negative.      Sensation: No abnormalities with testing of distal extremities to light touch.             PSYCHIATRIC:   Orientation is   intact to person, place and time.    Insight and judgment appear    both to be intact and appropriate.    Mood and affect are described as   normal mood and full affect .     SKIN:   Skin Inspection: No rashes or lesions.  ASSESSMENT: Glaucoma, severe stage   ICD#365.73   Nuclear cataract NOS   ICD#366.04   complex with poor dilating pupil right eye require special devices to open the pupil doing the operation.    Marylynn Pearson       Physician Orders  Name: AJEET, AVALON  Date:   DOB:  September 28, 1936  Age:  32 Allergies: Drug Allergies:     No known.   Diagnosis: Glaucoma, severe stage   ICD#365.73   Nuclear cataract NOS   ICD#366.04   complex with poor dilating pupil right eye require special devices to open the pupil doing the  operation.  Orders: CATARACT SURG W/IOL, 1 STAGE.    [id number] Dxs-  Modifiers-   Dilated fundus eval done  #2020F  Related Dxs-  Modifiers-      Actions:  Discuss with physician:    the risks and benefits of cataract surgery were discussed with the patient including possible worsening of his glaucoma and he agrees to proceed with cataract surgery left eye by phacoemulsification with  intraocular lens implant OS.  Plan: Phacoemulsificatoin with IOL OD      ____________________________ Marylynn Pearson

## 2012-07-27 ENCOUNTER — Encounter (HOSPITAL_COMMUNITY): Payer: Self-pay | Admitting: Vascular Surgery

## 2012-07-27 ENCOUNTER — Ambulatory Visit (HOSPITAL_COMMUNITY)
Admission: RE | Admit: 2012-07-27 | Discharge: 2012-07-27 | Disposition: A | Discharge status: Home / Self Care | Payer: Medicare Other | Source: Ambulatory Visit | Attending: Ophthalmology | Admitting: Ophthalmology

## 2012-07-27 ENCOUNTER — Encounter (HOSPITAL_COMMUNITY)
Admission: RE | Disposition: A | Discharge status: Home / Self Care | Payer: Self-pay | Source: Ambulatory Visit | Attending: Ophthalmology

## 2012-07-27 ENCOUNTER — Ambulatory Visit (HOSPITAL_COMMUNITY): Payer: Medicare Other | Admitting: Vascular Surgery

## 2012-07-27 ENCOUNTER — Encounter (HOSPITAL_COMMUNITY): Payer: Self-pay | Admitting: *Deleted

## 2012-07-27 DIAGNOSIS — Z01812 Encounter for preprocedural laboratory examination: Secondary | ICD-10-CM | POA: Insufficient documentation

## 2012-07-27 DIAGNOSIS — I10 Essential (primary) hypertension: Secondary | ICD-10-CM | POA: Insufficient documentation

## 2012-07-27 DIAGNOSIS — Z01818 Encounter for other preprocedural examination: Secondary | ICD-10-CM | POA: Insufficient documentation

## 2012-07-27 DIAGNOSIS — H2510 Age-related nuclear cataract, unspecified eye: Secondary | ICD-10-CM | POA: Insufficient documentation

## 2012-07-27 DIAGNOSIS — Z01810 Encounter for preprocedural cardiovascular examination: Secondary | ICD-10-CM | POA: Insufficient documentation

## 2012-07-27 DIAGNOSIS — H409 Unspecified glaucoma: Secondary | ICD-10-CM | POA: Insufficient documentation

## 2012-07-27 HISTORY — PX: CATARACT EXTRACTION W/PHACO: SHX586

## 2012-07-27 SURGERY — PHACOEMULSIFICATION, CATARACT, WITH IOL INSERTION
Anesthesia: Monitor Anesthesia Care | Site: Eye | Laterality: Right | Wound class: Clean

## 2012-07-27 MED ORDER — BUPIVACAINE HCL 0.75 % IJ SOLN
INTRAMUSCULAR | Status: AC
  Filled 2012-07-27: qty 10

## 2012-07-27 MED ORDER — BUPIVACAINE HCL 0.75 % IJ SOLN
INTRAMUSCULAR | Status: DC | PRN
  Administered 2012-07-27: 10 mL

## 2012-07-27 MED ORDER — LIDOCAINE-EPINEPHRINE 2 %-1:100000 IJ SOLN
INTRAMUSCULAR | Status: DC | PRN
  Administered 2012-07-27: 10 mL

## 2012-07-27 MED ORDER — BSS IO SOLN
INTRAOCULAR | Status: AC
  Filled 2012-07-27: qty 500

## 2012-07-27 MED ORDER — BSS IO SOLN
INTRAOCULAR | Status: DC | PRN
  Administered 2012-07-27: 15 mL via INTRAOCULAR

## 2012-07-27 MED ORDER — PROVISC 10 MG/ML IO SOLN
INTRAOCULAR | Status: DC | PRN
  Administered 2012-07-27: .85 mL via INTRAOCULAR

## 2012-07-27 MED ORDER — LIDOCAINE-EPINEPHRINE 2 %-1:100000 IJ SOLN
INTRAMUSCULAR | Status: AC
  Filled 2012-07-27: qty 1

## 2012-07-27 MED ORDER — HYALURONIDASE HUMAN 150 UNIT/ML IJ SOLN
INTRAMUSCULAR | Status: DC | PRN
  Administered 2012-07-27: 150 [IU]

## 2012-07-27 MED ORDER — NA CHONDROIT SULF-NA HYALURON 40-30 MG/ML IO SOLN
INTRAOCULAR | Status: AC
  Filled 2012-07-27: qty 0.5

## 2012-07-27 MED ORDER — EPINEPHRINE HCL 1 MG/ML IJ SOLN
INTRAOCULAR | Status: DC | PRN
  Administered 2012-07-27: 14:00:00

## 2012-07-27 MED ORDER — SODIUM CHLORIDE 0.9 % IV SOLN
INTRAVENOUS | Status: DC | PRN
  Administered 2012-07-27: 13:00:00 via INTRAVENOUS

## 2012-07-27 MED ORDER — NA CHONDROIT SULF-NA HYALURON 40-30 MG/ML IO SOLN
INTRAOCULAR | Status: DC | PRN
  Administered 2012-07-27 (×2): 0.5 mL via INTRAOCULAR

## 2012-07-27 MED ORDER — SODIUM HYALURONATE 10 MG/ML IO SOLN
INTRAOCULAR | Status: AC
  Filled 2012-07-27: qty 0.85

## 2012-07-27 MED ORDER — EPINEPHRINE HCL 1 MG/ML IJ SOLN
INTRAMUSCULAR | Status: AC
  Filled 2012-07-27: qty 1

## 2012-07-27 MED ORDER — SODIUM CHLORIDE 0.9 % IV SOLN
INTRAVENOUS | Status: DC
  Administered 2012-07-27: 13:00:00 via INTRAVENOUS

## 2012-07-27 MED ORDER — PROPOFOL 10 MG/ML IV EMUL
INTRAVENOUS | Status: DC | PRN
  Administered 2012-07-27: 40 mg via INTRAVENOUS

## 2012-07-27 MED ORDER — ONDANSETRON HCL 4 MG/2ML IJ SOLN
INTRAMUSCULAR | Status: DC | PRN
  Administered 2012-07-27: 4 mg via INTRAVENOUS

## 2012-07-27 MED ORDER — TOBRAMYCIN-DEXAMETHASONE 0.3-0.1 % OP OINT
TOPICAL_OINTMENT | OPHTHALMIC | Status: AC
  Filled 2012-07-27: qty 3.5

## 2012-07-27 MED ORDER — FENTANYL CITRATE 0.05 MG/ML IJ SOLN
INTRAMUSCULAR | Status: DC | PRN
  Administered 2012-07-27 (×2): 50 ug via INTRAVENOUS

## 2012-07-27 MED ORDER — TOBRAMYCIN 0.3 % OP OINT
TOPICAL_OINTMENT | OPHTHALMIC | Status: DC | PRN
  Administered 2012-07-27: 1 via OPHTHALMIC

## 2012-07-27 SURGICAL SUPPLY — 46 items
APL SRG 3 HI ABS STRL LF PLS (MISCELLANEOUS) ×1
APPLICATOR COTTON TIP 6IN STRL (MISCELLANEOUS) ×2 IMPLANT
APPLICATOR DR MATTHEWS STRL (MISCELLANEOUS) ×2 IMPLANT
BLADE KERATOME 2.75 (BLADE) ×2 IMPLANT
BLADE MINI RND TIP GREEN BEAV (BLADE) IMPLANT
BLADE STAB KNIFE 45DEG (BLADE) IMPLANT
CANNULA ANTERIOR CHAMBER 27GA (MISCELLANEOUS) ×2 IMPLANT
CLOTH BEACON ORANGE TIMEOUT ST (SAFETY) ×2 IMPLANT
CORDS BIPOLAR (ELECTRODE) IMPLANT
DRAPE OPHTHALMIC 40X48 W POUCH (DRAPES) ×2 IMPLANT
DRAPE RETRACTOR (MISCELLANEOUS) ×2 IMPLANT
FILTER BLUE MILLIPORE (MISCELLANEOUS) IMPLANT
GLOVE BIO SURGEON STRL SZ8 (GLOVE) ×2 IMPLANT
GLOVE ECLIPSE 6.5 STRL STRAW (GLOVE) ×1 IMPLANT
GLOVE SURG SS PI 6.5 STRL IVOR (GLOVE) ×1 IMPLANT
GOWN STRL NON-REIN LRG LVL3 (GOWN DISPOSABLE) ×4 IMPLANT
KIT BASIN OR (CUSTOM PROCEDURE TRAY) ×1 IMPLANT
KIT ROOM TURNOVER OR (KITS) ×2 IMPLANT
KNIFE CRESCENT 2.5 55 ANG (BLADE) IMPLANT
LENS IOL ACRSF IQ PC 18.0 (Intraocular Lens) IMPLANT
LENS IOL ACRYSOF IQ POST 18.0 (Intraocular Lens) ×2 IMPLANT
MARKER SKIN DUAL TIP RULER LAB (MISCELLANEOUS) ×1 IMPLANT
MASK EYE SHIELD (GAUZE/BANDAGES/DRESSINGS) ×1 IMPLANT
NDL 25GX 5/8IN NON SAFETY (NEEDLE) ×1 IMPLANT
NDL FILTER BLUNT 18X1 1/2 (NEEDLE) IMPLANT
NEEDLE 25GX 5/8IN NON SAFETY (NEEDLE) ×4 IMPLANT
NEEDLE FILTER BLUNT 18X 1/2SAF (NEEDLE) ×1
NEEDLE FILTER BLUNT 18X1 1/2 (NEEDLE) ×1 IMPLANT
NS IRRIG 1000ML POUR BTL (IV SOLUTION) ×2 IMPLANT
PACK CATARACT CUSTOM (CUSTOM PROCEDURE TRAY) ×2 IMPLANT
PACK CATARACT MCHSCP (PACKS) ×2 IMPLANT
PAD ARMBOARD 7.5X6 YLW CONV (MISCELLANEOUS) ×4 IMPLANT
PAD EYE OVAL STERILE LF (GAUZE/BANDAGES/DRESSINGS) ×1 IMPLANT
PROBE ANTERIOR 20G W/INFUS NDL (MISCELLANEOUS) IMPLANT
RING MALYGIN (MISCELLANEOUS) ×2 IMPLANT
SPEAR EYE SURG WECK-CEL (MISCELLANEOUS) IMPLANT
SUT ETHILON 10 0 CS140 6 (SUTURE) ×2 IMPLANT
SUT SILK 4 0 C 3 735G (SUTURE) IMPLANT
SUT SILK 6 0 G 6 (SUTURE) IMPLANT
SUT VICRYL 8 0 TG140 8 (SUTURE) IMPLANT
SYR 3ML LL SCALE MARK (SYRINGE) IMPLANT
TAPE SURG TRANSPORE 1 IN (GAUZE/BANDAGES/DRESSINGS) IMPLANT
TAPE SURGICAL TRANSPORE 1 IN (GAUZE/BANDAGES/DRESSINGS) ×1
TOWEL OR 17X24 6PK STRL BLUE (TOWEL DISPOSABLE) ×4 IMPLANT
WATER STERILE IRR 1000ML POUR (IV SOLUTION) ×2 IMPLANT
WIPE INSTRUMENT VISIWIPE 73X73 (MISCELLANEOUS) ×1 IMPLANT

## 2012-07-27 NOTE — Op Note (Signed)
Preoperative diagnosis: Complex cataract right eye following previous glaucoma filtration surgery with a poorly dilating pupil Postoperative diagnosis: Same Procedure: Phacoemulsification with intraocular lens implant using my Luken range to dilate the pupil Surgeon: Dr. Venetia Maxon Asst.: Lelon Frohlich Anesthesia: 2% Xylocaine with epinephrine in a 50-50 mixture 0.75% Marcaine with ample Wydase Procedure: The patient was taken to the operating room where he was given a peribulbar block with the aforementioned local anesthetic agent. Following this gentle pressure was applied to the eye after adequate akinesia and anesthesia were achieved the operating microscope was put into position and with the surgeon sitting temporally a Weck-Cel sponge was used to fixate the globe and a 15 blade was used to enter through clear cornea at the 11:00 position and no time during the surgery was the filtration bleb touched. Viscoat was injected into the eye. Following this another Weck-Cel sponge was used to fixate the globe and a 2.75 mm keratome blade was used in a stepwise fashion through temporal clear cornea it was noted the pupil was only 3 mm and did not move at all. Therefore an iris sweep was used to break posterior synechiae to the lens following this additional Viscoat was injected in the eye and then in the [place] ring was taken from the package it was noted to have no defects. This was a second my Luken range over the first ring did have a defect and we had to get a second running. Following this and the lumen ring was positioned on the pupil expanding it to approximately 6.1-6.2 mm at this point a bent 25-gauge needle was used to incise the anterior capsule and a continuous tear curvilinear capsulorrhexis was formed. Following this BSS was used to hydrodissect the nucleus which was fluffy white following this the eye a traction following this the phacoemulsification unit was used to sculpt for central troughs for troughs were  then snapped apart and all nuclear fragments were removed from the eye diet was then used to remove cortical fibers from the posterior capsule. The posterior capsule remained intact therefore Provisc was injected in the eye the intraocular lens implant was examined and noted to have no defects it was an Alcon AcrySof 18.0 diopter lens SN 60 WF SN [id number] the lens was injected and positioned following this the I/A was used to remove viscoelastic and subincisional cortex and the Luken ring was removed from the eye using a Kuglen hook and a ring retractor the eye was pressurized the lens was well centered a 10-0 nylon suture was placed and there was no leakage at the incision. TobraDex and the eye a patch were applied to the eye the patient returned to recovery area in stable condition Complications: None

## 2012-07-27 NOTE — Interval H&P Note (Signed)
History and Physical Interval Note:  07/27/2012 12:51 PM  Matthew Winters  has presented today for surgery, with the diagnosis of cataract right eye   The various methods of treatment have been discussed with the patient and family. After consideration of risks, benefits and other options for treatment, the patient has consented to  Procedure(s) (LRB): CATARACT EXTRACTION PHACO AND INTRAOCULAR LENS PLACEMENT ([place]) (Right) as a surgical intervention .  The patient's history has been reviewed, patient examined, no change in status, stable for surgery.  I have reviewed the patient's chart and labs.  Questions were answered to the patient's satisfaction.     Konya Fauble

## 2012-07-27 NOTE — Anesthesia Postprocedure Evaluation (Signed)
  Anesthesia Post-op Note  Patient: Matthew Winters  Procedure(s) Performed: Procedure(s) (LRB): CATARACT EXTRACTION PHACO AND INTRAOCULAR LENS PLACEMENT (IOC) (Right)  Patient Location: PACU  Anesthesia Type: MAC  Level of Consciousness: awake, alert , oriented and patient cooperative  Airway and Oxygen Therapy: Patient Spontanous Breathing  Post-op Pain: none  Post-op Assessment: Post-op Vital signs reviewed, Patient's Cardiovascular Status Stable, Respiratory Function Stable, Patent Airway, No signs of Nausea or vomiting and Pain level controlled  Post-op Vital Signs: stable  Complications: No apparent anesthesia complications

## 2012-07-27 NOTE — Anesthesia Preprocedure Evaluation (Addendum)
Anesthesia Evaluation  Patient identified by MRN, date of birth, ID band Patient awake    Reviewed: Allergy & Precautions, H&P , NPO status , Patient's Chart, lab work & pertinent test results, reviewed documented beta blocker date and time   Airway Mallampati: I TM Distance: >3 FB Neck ROM: full    Dental   Pulmonary          Cardiovascular hypertension, Rhythm:regular Rate:Normal     Neuro/Psych    GI/Hepatic   Endo/Other    Renal/GU      Musculoskeletal   Abdominal   Peds  Hematology   Anesthesia Other Findings   Reproductive/Obstetrics                          Anesthesia Physical Anesthesia Plan  ASA: II  Anesthesia Plan: General   Post-op Pain Management:    Induction: Intravenous  Airway Management Planned: Oral ETT  Additional Equipment:   Intra-op Plan:   Post-operative Plan: Extubation in OR  Informed Consent: I have reviewed the patients History and Physical, chart, labs and discussed the procedure including the risks, benefits and alternatives for the proposed anesthesia with the patient or authorized representative who has indicated his/her understanding and acceptance.     Plan Discussed with: CRNA, Anesthesiologist and Surgeon  Anesthesia Plan Comments:         Anesthesia Quick Evaluation

## 2012-07-27 NOTE — Transfer of Care (Signed)
Immediate Anesthesia Transfer of Care Note  Patient: Matthew Winters  Procedure(s) Performed: Procedure(s) (LRB): CATARACT EXTRACTION PHACO AND INTRAOCULAR LENS PLACEMENT (IOC) (Right)  Patient Location: PACU and Short Stay  Anesthesia Type: MAC  Level of Consciousness: awake, alert , oriented and patient cooperative  Airway & Oxygen Therapy: Patient Spontanous Breathing  Post-op Assessment: Report given to PACU RN, Post -op Vital signs reviewed and stable, Patient moving all extremities, Patient moving all extremities X 4 and Patient able to stick tongue midline  Post vital signs: stable  Complications: No apparent anesthesia complications

## 2012-07-27 NOTE — Preoperative (Signed)
Beta Blockers   Reason not to administer Beta Blockers:Not Applicable 

## 2012-07-28 ENCOUNTER — Encounter (HOSPITAL_COMMUNITY): Payer: Self-pay | Admitting: Ophthalmology

## 2013-10-11 ENCOUNTER — Encounter (HOSPITAL_BASED_OUTPATIENT_CLINIC_OR_DEPARTMENT_OTHER): Payer: Self-pay | Admitting: *Deleted

## 2013-10-11 NOTE — Progress Notes (Signed)
Pt could not tell me all his meds-he has not had to go back to cardiology since work up done 2004 Sees dr Minerva Areola difficult to get notes from them-you can call 3 days in a row and not get them He will come in for bmet-ekg-will bring meds and we need daughters phone number

## 2013-10-12 NOTE — H&P (Signed)
HISTORY AND PHYSICAL  Matthew Winters is a 77 y.o. male patient with CC: painful teeth  No diagnosis found.  Past Medical History  Diagnosis Date  . Hypertension   . Gout   . Arthritis   . History of blood transfusion     No current facility-administered medications for this encounter.   Current Outpatient Prescriptions  Medication Sig Dispense Refill  . amLODipine (NORVASC) 10 MG tablet Take 10 mg by mouth daily.      . colchicine 0.6 MG tablet Take 0.6 mg by mouth daily.      Marland Kitchen allopurinol (ZYLOPRIM) 100 MG tablet Take 100 mg by mouth daily as needed.      . brimonidine (ALPHAGAN) 0.2 % ophthalmic solution Place 1 drop into both eyes 3 (three) times daily.      . dorzolamide-timolol (COSOPT) 22.3-6.8 MG/ML ophthalmic solution Place 1 drop into both eyes 2 (two) times daily.      Marland Kitchen lisinopril (PRINIVIL,ZESTRIL) 20 MG tablet Take 20 mg by mouth daily.      Marland Kitchen losartan (COZAAR) 100 MG tablet Take 100 mg by mouth daily.      . Vitamin D, Ergocalciferol, (DRISDOL) 50000 UNITS CAPS Take 50,000 Units by mouth every 7 (seven) days.       No Known Allergies Active Problems:   * No active hospital problems. *  Vitals: Height 5\' 6"  (1.676 m), weight 70.308 kg (155 lb). Lab results:No results found for this or any previous visit (from the past 38 hour(s)). Radiology Results: No results found. General appearance: alert and cooperative Head: Normocephalic, without obvious abnormality, atraumatic Eyes: negative Ears: normal TM's and external ear canals both ears Nose: Nares normal. Septum midline. Mucosa normal. No drainage or sinus tenderness. Throat: grossly decayed teeth # 3, 20, 21, 22, 23, 24, 25, 26, 27, 28, 32 Neck: no adenopathy, supple, symmetrical, trachea midline and thyroid not enlarged, symmetric, no tenderness/mass/nodules Resp: clear to auscultation bilaterally Cardio: regular rate and rhythm, S1, S2 normal, no murmur, click, rub or gallop  Assessment: 77 BM HTN, Gout,  Arthritis with non-restorable teeth # 3, 20, 21, 22, 23, 24, 25, 26, 27, 28, 32.  Plan: extraction teeth # 3, 20, 21, 22, 23, 24, 25, 26, 27, 28, 32, alveoloplasty. General anesthesia. Day surgery.   Gae Bon 10/12/2013

## 2013-10-16 ENCOUNTER — Encounter (HOSPITAL_BASED_OUTPATIENT_CLINIC_OR_DEPARTMENT_OTHER)
Admission: RE | Disposition: A | Discharge status: Home / Self Care | Payer: Self-pay | Source: Ambulatory Visit | Attending: Oral Surgery

## 2013-10-16 ENCOUNTER — Ambulatory Visit (HOSPITAL_BASED_OUTPATIENT_CLINIC_OR_DEPARTMENT_OTHER)
Admission: RE | Admit: 2013-10-16 | Discharge: 2013-10-16 | Disposition: A | Discharge status: Home / Self Care | Payer: Medicare Other | Source: Ambulatory Visit | Attending: Oral Surgery | Admitting: Oral Surgery

## 2013-10-16 ENCOUNTER — Encounter (HOSPITAL_BASED_OUTPATIENT_CLINIC_OR_DEPARTMENT_OTHER): Payer: Medicare Other | Admitting: Certified Registered"

## 2013-10-16 ENCOUNTER — Encounter (HOSPITAL_BASED_OUTPATIENT_CLINIC_OR_DEPARTMENT_OTHER): Payer: Self-pay | Admitting: *Deleted

## 2013-10-16 ENCOUNTER — Ambulatory Visit (HOSPITAL_BASED_OUTPATIENT_CLINIC_OR_DEPARTMENT_OTHER): Payer: Medicare Other | Admitting: Certified Registered"

## 2013-10-16 DIAGNOSIS — Z79899 Other long term (current) drug therapy: Secondary | ICD-10-CM | POA: Insufficient documentation

## 2013-10-16 DIAGNOSIS — M129 Arthropathy, unspecified: Secondary | ICD-10-CM | POA: Insufficient documentation

## 2013-10-16 DIAGNOSIS — I10 Essential (primary) hypertension: Secondary | ICD-10-CM | POA: Insufficient documentation

## 2013-10-16 DIAGNOSIS — M109 Gout, unspecified: Secondary | ICD-10-CM | POA: Insufficient documentation

## 2013-10-16 DIAGNOSIS — K029 Dental caries, unspecified: Secondary | ICD-10-CM

## 2013-10-16 DIAGNOSIS — K089 Disorder of teeth and supporting structures, unspecified: Secondary | ICD-10-CM | POA: Insufficient documentation

## 2013-10-16 DIAGNOSIS — K0530 Chronic periodontitis, unspecified: Secondary | ICD-10-CM

## 2013-10-16 HISTORY — PX: MULTIPLE EXTRACTIONS WITH ALVEOLOPLASTY: SHX5342

## 2013-10-16 LAB — POCT I-STAT, CHEM 8
Calcium, Ion: 1.27 mmol/L (ref 1.13–1.30)
Chloride: 109 mEq/L (ref 96–112)
Creatinine, Ser: 2.5 mg/dL — ABNORMAL HIGH (ref 0.50–1.35)
Glucose, Bld: 94 mg/dL (ref 70–99)
HCT: 42 % (ref 39.0–52.0)
Hemoglobin: 14.3 g/dL (ref 13.0–17.0)
TCO2: 22 mmol/L (ref 0–100)

## 2013-10-16 SURGERY — MULTIPLE EXTRACTION WITH ALVEOLOPLASTY
Anesthesia: General | Site: Mouth | Wound class: Clean Contaminated

## 2013-10-16 MED ORDER — PROPOFOL 10 MG/ML IV BOLUS
INTRAVENOUS | Status: DC | PRN
  Administered 2013-10-16: 150 mg via INTRAVENOUS

## 2013-10-16 MED ORDER — BACITRACIN ZINC 500 UNIT/GM EX OINT
TOPICAL_OINTMENT | CUTANEOUS | Status: AC
  Filled 2013-10-16: qty 0.9

## 2013-10-16 MED ORDER — OXYCODONE HCL 5 MG/5ML PO SOLN: 5.0000 mg | Freq: Once | ORAL | Status: DC | PRN

## 2013-10-16 MED ORDER — OXYCODONE HCL 5 MG PO TABS: 5.0000 mg | ORAL_TABLET | Freq: Once | ORAL | Status: DC | PRN

## 2013-10-16 MED ORDER — CEFAZOLIN SODIUM 1-5 GM-% IV SOLN
INTRAVENOUS | Status: AC
  Filled 2013-10-16: qty 100

## 2013-10-16 MED ORDER — ONDANSETRON HCL 4 MG/2ML IJ SOLN
INTRAMUSCULAR | Status: DC | PRN
  Administered 2013-10-16: 4 mg via INTRAVENOUS

## 2013-10-16 MED ORDER — ONDANSETRON HCL 4 MG/2ML IJ SOLN: 4.0000 mg | Freq: Once | INTRAMUSCULAR | Status: DC | PRN

## 2013-10-16 MED ORDER — LIDOCAINE-EPINEPHRINE 2 %-1:100000 IJ SOLN
INTRAMUSCULAR | Status: AC
  Filled 2013-10-16: qty 5.1

## 2013-10-16 MED ORDER — LACTATED RINGERS IV SOLN
INTRAVENOUS | Status: DC
  Administered 2013-10-16: 09:00:00 via INTRAVENOUS

## 2013-10-16 MED ORDER — LIDOCAINE-EPINEPHRINE (PF) 1 %-1:200000 IJ SOLN
INTRAMUSCULAR | Status: AC
  Filled 2013-10-16: qty 10

## 2013-10-16 MED ORDER — CEFAZOLIN SODIUM-DEXTROSE 2-3 GM-% IV SOLR
2.0000 g | INTRAVENOUS | Status: AC
  Administered 2013-10-16: 2 g via INTRAVENOUS

## 2013-10-16 MED ORDER — MIDAZOLAM HCL 5 MG/5ML IJ SOLN
INTRAMUSCULAR | Status: DC | PRN
  Administered 2013-10-16: 2 mg via INTRAVENOUS

## 2013-10-16 MED ORDER — DEXAMETHASONE SODIUM PHOSPHATE 4 MG/ML IJ SOLN
INTRAMUSCULAR | Status: DC | PRN
  Administered 2013-10-16: 10 mg via INTRAVENOUS

## 2013-10-16 MED ORDER — LIDOCAINE-EPINEPHRINE 1 %-1:100000 IJ SOLN
INTRAMUSCULAR | Status: DC | PRN
  Administered 2013-10-16: 11 mL

## 2013-10-16 MED ORDER — FENTANYL CITRATE 0.05 MG/ML IJ SOLN
INTRAMUSCULAR | Status: AC
  Filled 2013-10-16: qty 2

## 2013-10-16 MED ORDER — OXYCODONE-ACETAMINOPHEN 5-325 MG PO TABS: 1.0000 | ORAL_TABLET | ORAL | Status: DC | PRN

## 2013-10-16 MED ORDER — FENTANYL CITRATE 0.05 MG/ML IJ SOLN
INTRAMUSCULAR | Status: DC | PRN
  Administered 2013-10-16: 100 ug via INTRAVENOUS

## 2013-10-16 MED ORDER — HYDROMORPHONE HCL PF 1 MG/ML IJ SOLN
INTRAMUSCULAR | Status: AC
  Filled 2013-10-16: qty 1

## 2013-10-16 MED ORDER — SUCCINYLCHOLINE CHLORIDE 20 MG/ML IJ SOLN
INTRAMUSCULAR | Status: DC | PRN
  Administered 2013-10-16: 100 mg via INTRAVENOUS

## 2013-10-16 MED ORDER — FENTANYL CITRATE 0.05 MG/ML IJ SOLN: 50.0000 ug | INTRAMUSCULAR | Status: DC | PRN

## 2013-10-16 MED ORDER — MIDAZOLAM HCL 2 MG/2ML IJ SOLN: 1.0000 mg | INTRAMUSCULAR | Status: DC | PRN

## 2013-10-16 MED ORDER — MIDAZOLAM HCL 2 MG/2ML IJ SOLN
INTRAMUSCULAR | Status: AC
  Filled 2013-10-16: qty 2

## 2013-10-16 MED ORDER — MEPERIDINE HCL 25 MG/ML IJ SOLN: 6.2500 mg | INTRAMUSCULAR | Status: DC | PRN

## 2013-10-16 MED ORDER — BACITRACIN ZINC 500 UNIT/GM EX OINT
TOPICAL_OINTMENT | CUTANEOUS | Status: DC | PRN
  Administered 2013-10-16: 1 via TOPICAL

## 2013-10-16 MED ORDER — HYDROMORPHONE HCL PF 1 MG/ML IJ SOLN: 0.2500 mg | INTRAMUSCULAR | Status: DC | PRN

## 2013-10-16 MED ORDER — HYDROMORPHONE HCL PF 1 MG/ML IJ SOLN
0.2500 mg | INTRAMUSCULAR | Status: DC | PRN
  Administered 2013-10-16: 0.5 mg via INTRAVENOUS
  Administered 2013-10-16: 0.25 mg via INTRAVENOUS

## 2013-10-16 MED ORDER — LIDOCAINE HCL (CARDIAC) 20 MG/ML IV SOLN
INTRAVENOUS | Status: DC | PRN
  Administered 2013-10-16: 100 mg via INTRAVENOUS

## 2013-10-16 SURGICAL SUPPLY — 43 items
BLADE DERMATOME SS (BLADE) IMPLANT
BLADE SURG 15 STRL LF DISP TIS (BLADE) ×1 IMPLANT
BLADE SURG 15 STRL SS (BLADE) ×2
BUR CROSS CUT FISSURE 1.6 (BURR) ×2 IMPLANT
BUR EGG ELITE 4.0 (BURR) ×2 IMPLANT
BUR FAST CUTTING (BURR)
BUR SRGRND 54.5X2.4X8 (BURR) IMPLANT
BURR SRGRND 54.5X2.4X8 (BURR)
CANISTER SUCT 1200ML W/VALVE (MISCELLANEOUS) ×2 IMPLANT
CATH ROBINSON RED A/P 10FR (CATHETERS) IMPLANT
COVER MAYO STAND STRL (DRAPES) ×2 IMPLANT
COVER TABLE BACK 60X90 (DRAPES) ×2 IMPLANT
DECANTER SPIKE VIAL GLASS SM (MISCELLANEOUS) IMPLANT
DEPRESSOR TONGUE BLADE STERILE (MISCELLANEOUS) IMPLANT
DRAPE U-SHAPE 76X120 STRL (DRAPES) ×2 IMPLANT
DRSG TEGADERM 4X10 (GAUZE/BANDAGES/DRESSINGS) IMPLANT
GAUZE PACKING FOLDED 2  STR (GAUZE/BANDAGES/DRESSINGS) ×1
GAUZE PACKING FOLDED 2 STR (GAUZE/BANDAGES/DRESSINGS) ×1 IMPLANT
GAUZE PACKING IODOFORM 1/4X5 (PACKING) IMPLANT
GLOVE BIO SURGEON STRL SZ 6.5 (GLOVE) ×2 IMPLANT
GLOVE BIO SURGEON STRL SZ7.5 (GLOVE) ×2 IMPLANT
GLOVE BIOGEL PI IND STRL 6.5 (GLOVE) ×2 IMPLANT
GLOVE BIOGEL PI INDICATOR 6.5 (GLOVE) ×1
GOWN PREVENTION PLUS XLARGE (GOWN DISPOSABLE) ×2 IMPLANT
GOWN PREVENTION PLUS XXLARGE (GOWN DISPOSABLE) ×2 IMPLANT
IV NS 500ML (IV SOLUTION) ×2
IV NS 500ML BAXH (IV SOLUTION) ×1 IMPLANT
NEEDLE HYPO 22GX1.5 SAFETY (NEEDLE) ×3 IMPLANT
NS IRRIG 1000ML POUR BTL (IV SOLUTION) ×2 IMPLANT
PACK BASIN DAY SURGERY FS (CUSTOM PROCEDURE TRAY) ×2 IMPLANT
SPONGE SURGIFOAM ABS GEL 12-7 (HEMOSTASIS) IMPLANT
STRIP CLOSURE SKIN 1/2X4 (GAUZE/BANDAGES/DRESSINGS) IMPLANT
SUT CHROMIC 3 0 PS 2 (SUTURE) ×2 IMPLANT
SYR 20CC LL (SYRINGE) IMPLANT
SYR BULB 3OZ (MISCELLANEOUS) ×2 IMPLANT
SYR CONTROL 10ML LL (SYRINGE) ×3 IMPLANT
TOOTHBRUSH ADULT (PERSONAL CARE ITEMS) IMPLANT
TOWEL OR 17X24 6PK STRL BLUE (TOWEL DISPOSABLE) ×2 IMPLANT
TOWEL OR NON WOVEN STRL DISP B (DISPOSABLE) ×2 IMPLANT
TRAY DSU PREP LF (CUSTOM PROCEDURE TRAY) IMPLANT
TUBE CONNECTING 20X1/4 (TUBING) ×2 IMPLANT
TUBING IRRIGATION (MISCELLANEOUS) ×2 IMPLANT
YANKAUER SUCT BULB TIP NO VENT (SUCTIONS) ×2 IMPLANT

## 2013-10-16 NOTE — Anesthesia Postprocedure Evaluation (Signed)
Anesthesia Post Note  Patient: Matthew Winters  Procedure(s) Performed: Procedure(s) (LRB): MULTIPLE EXTRACION WITH ALVEOLOPLASTY (N/A)  Anesthesia type: general  Patient location: PACU  Post pain: Pain level controlled  Post assessment: Patient's Cardiovascular Status Stable  Last Vitals:  Filed Vitals:   10/16/13 1145  BP: 150/90  Pulse: 94  Temp:   Resp: 16    Post vital signs: Reviewed and stable  Level of consciousness: sedated  Complications: No apparent anesthesia complications

## 2013-10-16 NOTE — Anesthesia Procedure Notes (Signed)
Procedure Name: Intubation Performed by: Tawni Millers Pre-anesthesia Checklist: Patient identified, Emergency Drugs available, Suction available and Patient being monitored Patient Re-evaluated:Patient Re-evaluated prior to inductionOxygen Delivery Method: Circle System Utilized Preoxygenation: Pre-oxygenation with 100% oxygen Intubation Type: IV induction Ventilation: Mask ventilation without difficulty Laryngoscope Size: Mac Grade View: Grade I Tube type: Oral Tube size: 7.0 mm Number of attempts: 1 Airway Equipment and Method: stylet and oral airway Placement Confirmation: ETT inserted through vocal cords under direct vision,  positive ETCO2 and breath sounds checked- equal and bilateral Tube secured with: Tape Dental Injury: Teeth and Oropharynx as per pre-operative assessment

## 2013-10-16 NOTE — Transfer of Care (Signed)
Immediate Anesthesia Transfer of Care Note  Patient: Matthew Winters  Procedure(s) Performed: Procedure(s): MULTIPLE EXTRACION WITH ALVEOLOPLASTY (N/A)  Patient Location: PACU  Anesthesia Type:General  Level of Consciousness: awake, alert  and patient cooperative  Airway & Oxygen Therapy: Patient Spontanous Breathing, Patient connected to face mask and aerosol face mask  Post-op Assessment: Report given to PACU RN and Post -op Vital signs reviewed and stable  Post vital signs: Reviewed and stable  Complications: No apparent anesthesia complications

## 2013-10-16 NOTE — Op Note (Signed)
10/16/2013  10:08 AM  PATIENT:  Matthew Winters  77 y.o. male  PRE-OPERATIVE DIAGNOSIS:  NON RESTORABLE TEETH #'s 3, 20, 21, 22, 23, 24, 25, 26, 27, 28, 32  POST-OPERATIVE DIAGNOSIS:  SAME  PROCEDURE:  Procedure(s): MULTIPLE EXTRACTION TEETH #'s 3, 20, 21, 22, 23, 24, 25, 26, 27, 28, 32 WITH ALVEOLOPLASTY  SURGEON:  Surgeon(s): Gae Bon, DDS  ANESTHESIA:   local and general  EBL:  minimal  DRAINS: none   SPECIMEN:  No Specimen  COUNTS:  YES  PLAN OF CARE: Discharge to home after PACU  PATIENT DISPOSITION:  PACU - hemodynamically stable.   PROCEDURE DETAILS: Dictation [id number]  Gae Bon, DMD 10/16/2013 10:08 AM

## 2013-10-16 NOTE — Anesthesia Preprocedure Evaluation (Addendum)
Anesthesia Evaluation  Patient identified by MRN, date of birth, ID band Patient awake    Reviewed: Allergy & Precautions, H&P , NPO status , Patient's Chart, lab work & pertinent test results  Airway Mallampati: I TM Distance: >3 FB Neck ROM: Full    Dental   Pulmonary          Cardiovascular hypertension, Pt. on medications     Neuro/Psych    GI/Hepatic   Endo/Other    Renal/GU      Musculoskeletal   Abdominal   Peds  Hematology   Anesthesia Other Findings   Reproductive/Obstetrics                          Anesthesia Physical Anesthesia Plan  ASA: III  Anesthesia Plan: General   Post-op Pain Management:    Induction: Intravenous  Airway Management Planned: Nasal ETT  Additional Equipment:   Intra-op Plan:   Post-operative Plan: Extubation in OR  Informed Consent:   Plan Discussed with: CRNA and Surgeon  Anesthesia Plan Comments:         Anesthesia Quick Evaluation

## 2013-10-16 NOTE — H&P (Signed)
H&P documentation  -History and Physical Reviewed  -Patient has been re-examined  -No change in the plan of care  Machael Raine M  

## 2013-10-17 ENCOUNTER — Encounter (HOSPITAL_BASED_OUTPATIENT_CLINIC_OR_DEPARTMENT_OTHER): Payer: Self-pay | Admitting: Oral Surgery

## 2013-10-17 NOTE — Op Note (Signed)
Matthew Winters, Matthew Winters NO.:  [id number]  MEDICAL RECORD NO.:  [id number]  LOCATION:                               FACILITY:  [facility]  PHYSICIAN:  Gae Bon, M.D.  DATE OF BIRTH:  1936-10-06  DATE OF PROCEDURE:  10/16/2013 DATE OF DISCHARGE:  10/16/2013                              OPERATIVE REPORT   PREOPERATIVE DIAGNOSIS:  Nonrestorable teeth #3, 20, 21, 22, 23, 24, 25, 26, 27, 28, 32.  POSTOPERATIVE DIAGNOSIS:  Nonrestorable teeth #3, 20, 21, 22, 23, 24, 25, 26, 27, 28, 32.  PROCEDURE:  Extraction of teeth # 3, 20, 21 22, 23, 24, 25, 26, 27, 28, 32.  Alveoplasty right and left mandible.  SURGEON:  Gae Bon, MD  ANESTHESIA:  General.  Dr. Conrad Athens attending.  INDICATIONS FOR PROCEDURE:  Matthew Winters is a 77 year old male who was referred to me by his general dentist for removal of all remaining teeth so that dentures could be fabricated.  He has a complicated past medical history of hypertension, gout, arthritis.  I have counseled him the nature of surgery and the need for adequate anesthesia for patient comfort.  It was recommended that surgery be performed at Day Surgery so that the patient could be intubated for airway protection and undergo general anesthesia.  PROCEDURE:  The patient was taken to the operating room, placed on the table in supine position.  General anesthesia was administered via intravenous medications, and oral endotracheal tube was placed and marked.  The eyes were protected.  The patient was draped for the procedure, and a time-out was performed.  Then, the posterior pharynx was suctioned.  A throat pack was placed.  A 2% lidocaine with 1:100,000 epinephrine was infiltrated in an inferior alveolar block on the right and left side and a buccal and palatal infiltration around tooth #3. Additional solution was deposited in the buccal vestibule of the anterior mandible.  A total of 12 mL was utilized.  Then, a bite block was placed  in the right side of the mouth and a sweetheart retractor was used to retract the tongue.  A 15 blade was used to make an incision around teeth #20, 21, 22, 23, 24, 25, 26 on the buccal and lingual aspects.  The periosteum was reflected with a periosteal elevator.  The teeth were elevated with 301 elevator and removed from the mouth with the Asch forceps.  The sockets were then curetted and then the periosteum was further reflected to expose the alveolar crest.  An alveoplasty was performed using the egg-shaped bur and bone file.  Then, the area was irrigated and closed after trimming the gingival tissue with a 15 blade with 3-0 chromic.  Then, the bite block and sweetheart retractor were repositioned to the other side of the mouth.  A 15 blade was used to make an incision around teeth #27, 28, 3 and 32.  The periosteum was reflected.  The lower teeth were removed using the 301 elevator and the Asch forceps.  The upper tooth was removed using the periosteal elevator and a rongeur.  Then all sockets were curetted.  The mandibular sockets were curetted  as well, and the periosteum was reflected in the area of teeth #27 and 28 so that the alveoplasty could be performed using the egg-shaped burr.  Then, the area was irrigated and closed with 3-0 chromic.  The oral cavity was then inspected and found to have good contour, hemostasis and closure.  The oral cavity was irrigated and suctioned.  The throat pack was removed.  The patient was awakened, taken to the recovery room, breathing spontaneously in good condition.  ESTIMATED BLOOD LOSS:  Minimum.  COMPLICATIONS:  None.  SPECIMENS:  None.     Gae Bon, M.D.     SMJ/MEDQ  D:  10/16/2013  T:  10/17/2013  Job:  [id number]

## 2015-01-30 DIAGNOSIS — N184 Chronic kidney disease, stage 4 (severe): Secondary | ICD-10-CM | POA: Diagnosis not present

## 2015-04-18 ENCOUNTER — Encounter: Payer: Self-pay | Admitting: Gastroenterology

## 2015-04-18 DIAGNOSIS — H663X1 Other chronic suppurative otitis media, right ear: Secondary | ICD-10-CM | POA: Diagnosis not present

## 2015-04-18 DIAGNOSIS — H7291 Unspecified perforation of tympanic membrane, right ear: Secondary | ICD-10-CM | POA: Diagnosis not present

## 2015-05-01 ENCOUNTER — Encounter: Payer: Self-pay | Admitting: Gastroenterology

## 2015-05-23 DIAGNOSIS — H7201 Central perforation of tympanic membrane, right ear: Secondary | ICD-10-CM | POA: Diagnosis not present

## 2015-06-18 ENCOUNTER — Ambulatory Visit (AMBULATORY_SURGERY_CENTER): Payer: Self-pay | Admitting: *Deleted

## 2015-06-18 VITALS — Ht 66.0 in | Wt 180.4 lb

## 2015-06-18 DIAGNOSIS — Z86010 Personal history of colonic polyps: Secondary | ICD-10-CM

## 2015-06-18 MED ORDER — NA SULFATE-K SULFATE-MG SULF 17.5-3.13-1.6 GM/177ML PO SOLN: 1.0000 | Freq: Once | ORAL | Status: DC

## 2015-06-18 NOTE — Progress Notes (Signed)
No egg or soy allergy No issues with pasts sedation No diet pills No home 02 Pt has no e mail for emmi video

## 2015-07-02 ENCOUNTER — Ambulatory Visit (AMBULATORY_SURGERY_CENTER): Payer: Medicare Other | Admitting: Gastroenterology

## 2015-07-02 ENCOUNTER — Encounter: Payer: Self-pay | Admitting: Gastroenterology

## 2015-07-02 VITALS — BP 126/79 | HR 69 | Temp 96.9°F | Resp 20 | Ht 66.0 in | Wt 180.0 lb

## 2015-07-02 DIAGNOSIS — D122 Benign neoplasm of ascending colon: Secondary | ICD-10-CM | POA: Diagnosis not present

## 2015-07-02 DIAGNOSIS — D125 Benign neoplasm of sigmoid colon: Secondary | ICD-10-CM | POA: Diagnosis not present

## 2015-07-02 DIAGNOSIS — E669 Obesity, unspecified: Secondary | ICD-10-CM | POA: Diagnosis not present

## 2015-07-02 DIAGNOSIS — D123 Benign neoplasm of transverse colon: Secondary | ICD-10-CM

## 2015-07-02 DIAGNOSIS — Z86010 Personal history of colonic polyps: Secondary | ICD-10-CM | POA: Diagnosis present

## 2015-07-02 DIAGNOSIS — I10 Essential (primary) hypertension: Secondary | ICD-10-CM | POA: Diagnosis not present

## 2015-07-02 DIAGNOSIS — D124 Benign neoplasm of descending colon: Secondary | ICD-10-CM | POA: Diagnosis not present

## 2015-07-02 MED ORDER — SODIUM CHLORIDE 0.9 % IV SOLN: 500.0000 mL | INTRAVENOUS | Status: DC

## 2015-07-02 NOTE — Op Note (Signed)
[facility]  [address] [place], [address]   COLONOSCOPY PROCEDURE REPORT  PATIENT: Matthew Winters, Matthew Winters  MR#: [id number] BIRTHDATE: [birth year] , 79  yrs. old GENDER: male ENDOSCOPIST: Ladene Artist, MD, [facility] PROCEDURE DATE:  07/02/2015 PROCEDURE:   Colonoscopy, surveillance , Colonoscopy with biopsy, and Colonoscopy with snare polypectomy First Screening Colonoscopy - Avg.  risk and is 50 yrs.  old or older - No.  Prior Negative Screening - Now for repeat screening. N/A  History of Adenoma - Now for follow-up colonoscopy & has been > or = to 3 yrs.  Yes hx of adenoma.  Has been 3 or more years since last colonoscopy.  Polyps removed today? Yes ASA CLASS:   Class II INDICATIONS:Surveillance due to prior colonic neoplasia and PH Colon Adenoma. MEDICATIONS: Monitored anesthesia care and Propofol 220 mg IV DESCRIPTION OF PROCEDURE:   After the risks benefits and alternatives of the procedure were thoroughly explained, informed consent was obtained.  The digital rectal exam revealed no abnormalities of the rectum.   The LB [id number]-H190 [id number]  endoscope was introduced through the anus and advanced to the cecum, which was identified by both the appendix and ileocecal valve. No adverse events experienced.   The quality of the prep was good.  (Suprep was used)  The instrument was then slowly withdrawn as the colon was fully examined. Estimated blood loss is zero unless otherwise noted in this procedure report.       COLON FINDINGS: A sessile polyp measuring 3 mm in size was found in the ascending colon.  A polypectomy was performed with cold forceps.  The resection was complete, the polyp tissue was completely retrieved and sent to histology.   Six sessile polyps measuring 4-6 mm in size were found in the transverse colon. Polypectomies were performed with a cold snare.  The resection was complete, the polyp tissue was completely retrieved and sent to histology.   Two  semi-pedunculated polyps measuring 8 mm in size were found in the sigmoid colon and descending colon. Polypectomies were performed using snare cautery.  The resection was complete, the polyp tissue was completely retrieved and sent to histology.   A sessile polyp measuring 5 mm in size was found in the descending colon.  A polypectomy was performed with a cold snare.  The resection was complete, the polyp tissue was completely retrieved and sent to histology.   There was mild diverticulosis noted in the sigmoid colon.   The examination was otherwise normal. Retroflexed views revealed internal Grade I hemorrhoids. The time to cecum = 2 min 22 sec Withdrawal time = 13 min 54 sec   The scope was withdrawn and the procedure completed. COMPLICATIONS: There were no immediate complications.  ENDOSCOPIC IMPRESSION: 1.   Sessile polyp in the ascending colon; polypectomy performed with cold forceps 2.   Six sessile polyps in the transverse colon; polypectomies performed with a cold snare 3.   Two semi-pedunculated polyps in the sigmoid and descending colon; polypectomies performed using snare cautery 4.   Sessile polyp in the descending colon; polypectomy performed with a cold snare 5.   Mild diverticulosis in the sigmoid colon 6.   Grade l internal hemorrhoids  RECOMMENDATIONS: 1.  Hold Aspirin and all other NSAIDS for 2 weeks. 2.  Await pathology results 3.  High fiber diet with liberal fluid intake. 4.  Repeat Colonoscopy in 2 years.  eSigned:  Ladene Artist, MD, [facility] 07/02/2015 12:01 PM     PATIENT  NAME:  Kaydyn, Mizelle MR#: [id number]

## 2015-07-02 NOTE — Progress Notes (Signed)
Called to room to assist during endoscopic procedure.  Patient ID and intended procedure confirmed with present staff. Received instructions for my participation in the procedure from the performing physician.  

## 2015-07-02 NOTE — Patient Instructions (Signed)
YOU HAD AN ENDOSCOPIC PROCEDURE TODAY AT [facility]:   Refer to the procedure report that was given to you for any specific questions about what was found during the examination.  If the procedure report does not answer your questions, please call your gastroenterologist to clarify.  If you requested that your care partner not be given the details of your procedure findings, then the procedure report has been included in a sealed envelope for you to review at your convenience later.  YOU SHOULD EXPECT: Some feelings of bloating in the abdomen. Passage of more gas than usual.  Walking can help get rid of the air that was put into your GI tract during the procedure and reduce the bloating. If you had a lower endoscopy (such as a colonoscopy or flexible sigmoidoscopy) you may notice spotting of blood in your stool or on the toilet paper. If you underwent a bowel prep for your procedure, you may not have a normal bowel movement for a few days.  Please Note:  You might notice some irritation and congestion in your nose or some drainage.  This is from the oxygen used during your procedure.  There is no need for concern and it should clear up in a day or so.  SYMPTOMS TO REPORT IMMEDIATELY:   Following lower endoscopy (colonoscopy or flexible sigmoidoscopy):  Excessive amounts of blood in the stool  Significant tenderness or worsening of abdominal pains  Swelling of the abdomen that is new, acute  Fever of 100F or higher   For urgent or emergent issues, a gastroenterologist can be reached at any hour by calling [phone].   DIET: Your first meal following the procedure should be a small meal and then it is ok to progress to your normal diet. Heavy or fried foods are harder to digest and may make you feel nauseous or bloated.  Likewise, meals heavy in dairy and vegetables can increase bloating.  Drink plenty of fluids but you should avoid alcoholic beverages for 24  hours.  ACTIVITY:  You should plan to take it easy for the rest of today and you should NOT DRIVE or use heavy machinery until tomorrow (because of the sedation medicines used during the test).    FOLLOW UP: Our staff will call the number listed on your records the next business day following your procedure to check on you and address any questions or concerns that you may have regarding the information given to you following your procedure. If we do not reach you, we will leave a message.  However, if you are feeling well and you are not experiencing any problems, there is no need to return our call.  We will assume that you have returned to your regular daily activities without incident.  If any biopsies were taken you will be contacted by phone or by letter within the next 1-3 weeks.  Please call us at [phone] if you have not heard about the biopsies in 3 weeks.    SIGNATURES/CONFIDENTIALITY: You and/or your care partner have signed paperwork which will be entered into your electronic medical record.  These signatures attest to the fact that that the information above on your After Visit Summary has been reviewed and is understood.  Full responsibility of the confidentiality of this discharge information lies with you and/or your care-partner.   HOLD ASPIRIN AND ALL OTHER NSAIDS FOR 2 WEEKS, RESUME REMAINDER OF MEDICATIONS.INFORMATION GIVEN ON POLYPS,DIVERTICULOSIS,HEMORRHOIDS AND HIGH FIBER DIET.

## 2015-07-02 NOTE — Progress Notes (Signed)
Report to PACU, RN, vss, BBS= Clear.  

## 2015-07-03 ENCOUNTER — Telehealth: Payer: Self-pay | Admitting: *Deleted

## 2015-07-03 NOTE — Telephone Encounter (Signed)
  Follow up Call-  Call back number 07/02/2015  Post procedure Call Back phone  # [phone]  Permission to leave phone message Yes     Patient questions:  Do you have a fever, pain , or abdominal swelling? No. Pain Score  0 *  Have you tolerated food without any problems? Yes.    Have you been able to return to your normal activities? Yes.    Do you have any questions about your discharge instructions: Diet   No. Medications  No. Follow up visit  No.  Do you have questions or concerns about your Care? No.  Actions: * If pain score is 4 or above: No action needed, pain <4.

## 2015-07-13 ENCOUNTER — Encounter: Payer: Self-pay | Admitting: Gastroenterology

## 2015-09-03 ENCOUNTER — Encounter: Payer: Self-pay | Admitting: Gastroenterology

## 2015-10-28 DIAGNOSIS — Z1389 Encounter for screening for other disorder: Secondary | ICD-10-CM | POA: Diagnosis not present

## 2015-10-28 DIAGNOSIS — I10 Essential (primary) hypertension: Secondary | ICD-10-CM | POA: Diagnosis not present

## 2015-10-28 DIAGNOSIS — E784 Other hyperlipidemia: Secondary | ICD-10-CM | POA: Diagnosis not present

## 2015-10-28 DIAGNOSIS — N410 Acute prostatitis: Secondary | ICD-10-CM | POA: Diagnosis not present

## 2015-10-28 DIAGNOSIS — Z008 Encounter for other general examination: Secondary | ICD-10-CM | POA: Diagnosis not present

## 2015-10-28 DIAGNOSIS — M109 Gout, unspecified: Secondary | ICD-10-CM | POA: Diagnosis not present

## 2015-10-28 DIAGNOSIS — Z131 Encounter for screening for diabetes mellitus: Secondary | ICD-10-CM | POA: Diagnosis not present

## 2015-11-06 DIAGNOSIS — H35039 Hypertensive retinopathy, unspecified eye: Secondary | ICD-10-CM | POA: Diagnosis not present

## 2015-11-06 DIAGNOSIS — H401133 Primary open-angle glaucoma, bilateral, severe stage: Secondary | ICD-10-CM | POA: Diagnosis not present

## 2015-11-18 DIAGNOSIS — N3289 Other specified disorders of bladder: Secondary | ICD-10-CM | POA: Diagnosis not present

## 2015-11-18 DIAGNOSIS — N184 Chronic kidney disease, stage 4 (severe): Secondary | ICD-10-CM | POA: Diagnosis not present

## 2017-05-18 ENCOUNTER — Encounter: Payer: Self-pay | Admitting: Gastroenterology

## 2018-07-09 ENCOUNTER — Emergency Department (HOSPITAL_COMMUNITY): Payer: Medicare Other

## 2018-07-09 ENCOUNTER — Encounter (HOSPITAL_COMMUNITY): Payer: Self-pay | Admitting: Emergency Medicine

## 2018-07-09 ENCOUNTER — Inpatient Hospital Stay (HOSPITAL_COMMUNITY)
Admission: EM | Admit: 2018-07-09 | Discharge: 2018-07-16 | DRG: 683 | Disposition: A | Discharge status: Home Health | Payer: Medicare Other | Attending: Internal Medicine | Admitting: Internal Medicine

## 2018-07-09 ENCOUNTER — Other Ambulatory Visit: Payer: Self-pay

## 2018-07-09 DIAGNOSIS — Z8249 Family history of ischemic heart disease and other diseases of the circulatory system: Secondary | ICD-10-CM | POA: Diagnosis not present

## 2018-07-09 DIAGNOSIS — H409 Unspecified glaucoma: Secondary | ICD-10-CM | POA: Diagnosis present

## 2018-07-09 DIAGNOSIS — R338 Other retention of urine: Secondary | ICD-10-CM | POA: Diagnosis present

## 2018-07-09 DIAGNOSIS — I10 Essential (primary) hypertension: Secondary | ICD-10-CM

## 2018-07-09 DIAGNOSIS — D61818 Other pancytopenia: Secondary | ICD-10-CM

## 2018-07-09 DIAGNOSIS — E860 Dehydration: Secondary | ICD-10-CM | POA: Diagnosis present

## 2018-07-09 DIAGNOSIS — Z961 Presence of intraocular lens: Secondary | ICD-10-CM | POA: Diagnosis present

## 2018-07-09 DIAGNOSIS — N189 Chronic kidney disease, unspecified: Secondary | ICD-10-CM

## 2018-07-09 DIAGNOSIS — R630 Anorexia: Secondary | ICD-10-CM

## 2018-07-09 DIAGNOSIS — N183 Chronic kidney disease, stage 3 unspecified: Secondary | ICD-10-CM | POA: Diagnosis present

## 2018-07-09 DIAGNOSIS — R740 Nonspecific elevation of levels of transaminase and lactic acid dehydrogenase [LDH]: Secondary | ICD-10-CM | POA: Diagnosis present

## 2018-07-09 DIAGNOSIS — K5790 Diverticulosis of intestine, part unspecified, without perforation or abscess without bleeding: Secondary | ICD-10-CM | POA: Diagnosis present

## 2018-07-09 DIAGNOSIS — N401 Enlarged prostate with lower urinary tract symptoms: Secondary | ICD-10-CM | POA: Diagnosis present

## 2018-07-09 DIAGNOSIS — E785 Hyperlipidemia, unspecified: Secondary | ICD-10-CM | POA: Diagnosis present

## 2018-07-09 DIAGNOSIS — Z79899 Other long term (current) drug therapy: Secondary | ICD-10-CM | POA: Diagnosis not present

## 2018-07-09 DIAGNOSIS — R748 Abnormal levels of other serum enzymes: Secondary | ICD-10-CM | POA: Diagnosis not present

## 2018-07-09 DIAGNOSIS — R531 Weakness: Secondary | ICD-10-CM | POA: Diagnosis not present

## 2018-07-09 DIAGNOSIS — Z87891 Personal history of nicotine dependence: Secondary | ICD-10-CM | POA: Diagnosis not present

## 2018-07-09 DIAGNOSIS — B370 Candidal stomatitis: Secondary | ICD-10-CM | POA: Diagnosis present

## 2018-07-09 DIAGNOSIS — R5381 Other malaise: Secondary | ICD-10-CM | POA: Diagnosis present

## 2018-07-09 DIAGNOSIS — N179 Acute kidney failure, unspecified: Principal | ICD-10-CM | POA: Diagnosis present

## 2018-07-09 DIAGNOSIS — M109 Gout, unspecified: Secondary | ICD-10-CM | POA: Diagnosis present

## 2018-07-09 DIAGNOSIS — I2510 Atherosclerotic heart disease of native coronary artery without angina pectoris: Secondary | ICD-10-CM | POA: Diagnosis present

## 2018-07-09 DIAGNOSIS — K560 Paralytic ileus: Secondary | ICD-10-CM | POA: Diagnosis present

## 2018-07-09 DIAGNOSIS — M1990 Unspecified osteoarthritis, unspecified site: Secondary | ICD-10-CM | POA: Diagnosis present

## 2018-07-09 DIAGNOSIS — R112 Nausea with vomiting, unspecified: Secondary | ICD-10-CM

## 2018-07-09 DIAGNOSIS — I129 Hypertensive chronic kidney disease with stage 1 through stage 4 chronic kidney disease, or unspecified chronic kidney disease: Secondary | ICD-10-CM | POA: Diagnosis present

## 2018-07-09 DIAGNOSIS — D631 Anemia in chronic kidney disease: Secondary | ICD-10-CM | POA: Diagnosis present

## 2018-07-09 DIAGNOSIS — Z86010 Personal history of colonic polyps: Secondary | ICD-10-CM

## 2018-07-09 DIAGNOSIS — R7989 Other specified abnormal findings of blood chemistry: Secondary | ICD-10-CM | POA: Diagnosis present

## 2018-07-09 DIAGNOSIS — Z9841 Cataract extraction status, right eye: Secondary | ICD-10-CM

## 2018-07-09 DIAGNOSIS — R1110 Vomiting, unspecified: Secondary | ICD-10-CM

## 2018-07-09 LAB — IRON AND TIBC
IRON: 96 ug/dL (ref 45–182)
Saturation Ratios: 52 % — ABNORMAL HIGH (ref 17.9–39.5)
TIBC: 184 ug/dL — ABNORMAL LOW (ref 250–450)
UIBC: 88 ug/dL

## 2018-07-09 LAB — CBC WITH DIFFERENTIAL/PLATELET
BASOS ABS: 0 10*3/uL (ref 0.0–0.1)
BASOS PCT: 0 %
Eosinophils Absolute: 0.1 10*3/uL (ref 0.0–0.7)
Eosinophils Relative: 2 %
HEMATOCRIT: 32.2 % — AB (ref 39.0–52.0)
HEMOGLOBIN: 10.6 g/dL — AB (ref 13.0–17.0)
Lymphocytes Relative: 26 %
Lymphs Abs: 0.8 10*3/uL (ref 0.7–4.0)
MCH: 27.5 pg (ref 26.0–34.0)
MCHC: 32.9 g/dL (ref 30.0–36.0)
MCV: 83.4 fL (ref 78.0–100.0)
Monocytes Absolute: 0.4 10*3/uL (ref 0.1–1.0)
Monocytes Relative: 11 %
NEUTROS ABS: 1.9 10*3/uL (ref 1.7–7.7)
NEUTROS PCT: 61 %
Platelets: 130 10*3/uL — ABNORMAL LOW (ref 150–400)
RBC: 3.86 MIL/uL — ABNORMAL LOW (ref 4.22–5.81)
RDW: 16.1 % — AB (ref 11.5–15.5)
WBC: 3.2 10*3/uL — ABNORMAL LOW (ref 4.0–10.5)

## 2018-07-09 LAB — RETICULOCYTES
RBC.: 3.81 MIL/uL — AB (ref 4.22–5.81)
RETIC COUNT ABSOLUTE: 41.9 10*3/uL (ref 19.0–186.0)
Retic Ct Pct: 1.1 % (ref 0.4–3.1)

## 2018-07-09 LAB — COMPREHENSIVE METABOLIC PANEL
ALT: 120 U/L — AB (ref 0–44)
AST: 126 U/L — AB (ref 15–41)
Albumin: 4.1 g/dL (ref 3.5–5.0)
Alkaline Phosphatase: 99 U/L (ref 38–126)
Anion gap: 12 (ref 5–15)
BILIRUBIN TOTAL: 1.4 mg/dL — AB (ref 0.3–1.2)
BUN: 54 mg/dL — AB (ref 8–23)
CO2: 21 mmol/L — ABNORMAL LOW (ref 22–32)
CREATININE: 3.42 mg/dL — AB (ref 0.61–1.24)
Calcium: 9.5 mg/dL (ref 8.9–10.3)
Chloride: 105 mmol/L (ref 98–111)
GFR calc Af Amer: 18 mL/min — ABNORMAL LOW (ref >60)
GFR, EST NON AFRICAN AMERICAN: 16 mL/min — AB (ref >60)
Glucose, Bld: 83 mg/dL (ref 70–99)
Potassium: 4.4 mmol/L (ref 3.5–5.1)
Sodium: 138 mmol/L (ref 135–145)
TOTAL PROTEIN: 7.6 g/dL (ref 6.5–8.1)

## 2018-07-09 LAB — MAGNESIUM: MAGNESIUM: 2.2 mg/dL (ref 1.7–2.4)

## 2018-07-09 LAB — I-STAT TROPONIN, ED: TROPONIN I, POC: 0.04 ng/mL (ref 0.00–0.08)

## 2018-07-09 LAB — URINALYSIS, ROUTINE W REFLEX MICROSCOPIC
Bacteria, UA: NONE SEEN
Bilirubin Urine: NEGATIVE
GLUCOSE, UA: NEGATIVE mg/dL
Ketones, ur: 5 mg/dL — AB
Leukocytes, UA: NEGATIVE
Nitrite: NEGATIVE
Protein, ur: NEGATIVE mg/dL
SPECIFIC GRAVITY, URINE: 1.011 (ref 1.005–1.030)
pH: 5 (ref 5.0–8.0)

## 2018-07-09 LAB — FOLATE: FOLATE: 7.5 ng/mL (ref >5.9)

## 2018-07-09 LAB — CBC
HCT: 33.1 % — ABNORMAL LOW (ref 39.0–52.0)
Hemoglobin: 10.8 g/dL — ABNORMAL LOW (ref 13.0–17.0)
MCH: 26.9 pg (ref 26.0–34.0)
MCHC: 32.6 g/dL (ref 30.0–36.0)
MCV: 82.5 fL (ref 78.0–100.0)
Platelets: 171 10*3/uL (ref 150–400)
RBC: 4.01 MIL/uL — ABNORMAL LOW (ref 4.22–5.81)
RDW: 15.9 % — AB (ref 11.5–15.5)
WBC: 2.8 10*3/uL — ABNORMAL LOW (ref 4.0–10.5)

## 2018-07-09 LAB — LIPASE, BLOOD: Lipase: 59 U/L — ABNORMAL HIGH (ref 11–51)

## 2018-07-09 LAB — VITAMIN B12: Vitamin B-12: 964 pg/mL — ABNORMAL HIGH (ref 180–914)

## 2018-07-09 LAB — FERRITIN: Ferritin: 871 ng/mL — ABNORMAL HIGH (ref 24–336)

## 2018-07-09 LAB — POC OCCULT BLOOD, ED: FECAL OCCULT BLD: NEGATIVE

## 2018-07-09 LAB — TROPONIN I: TROPONIN I: 0.05 ng/mL — AB (ref <0.03)

## 2018-07-09 LAB — CK: CK TOTAL: 485 U/L — AB (ref 49–397)

## 2018-07-09 MED ORDER — AMLODIPINE BESYLATE 5 MG PO TABS
5.0000 mg | ORAL_TABLET | Freq: Every day | ORAL | Status: DC
  Administered 2018-07-10: 5 mg via ORAL
  Filled 2018-07-09: qty 1

## 2018-07-09 MED ORDER — BRINZOLAMIDE 1 % OP SUSP
1.0000 [drp] | Freq: Three times a day (TID) | OPHTHALMIC | Status: DC
  Administered 2018-07-10 – 2018-07-16 (×19): 1 [drp] via OPHTHALMIC
  Filled 2018-07-09: qty 10

## 2018-07-09 MED ORDER — LACTATED RINGERS IV BOLUS
1000.0000 mL | Freq: Once | INTRAVENOUS | Status: AC
  Administered 2018-07-09: 1000 mL via INTRAVENOUS

## 2018-07-09 MED ORDER — TAMSULOSIN HCL 0.4 MG PO CAPS
0.4000 mg | ORAL_CAPSULE | Freq: Every day | ORAL | Status: DC
  Administered 2018-07-10: 0.4 mg via ORAL
  Filled 2018-07-09: qty 1

## 2018-07-09 MED ORDER — ALLOPURINOL 100 MG PO TABS
100.0000 mg | ORAL_TABLET | Freq: Every day | ORAL | Status: DC | PRN
  Filled 2018-07-09: qty 1

## 2018-07-09 MED ORDER — ONDANSETRON HCL 4 MG/2ML IJ SOLN: 4.0000 mg | Freq: Four times a day (QID) | INTRAMUSCULAR | Status: DC | PRN

## 2018-07-09 MED ORDER — MORPHINE SULFATE (PF) 2 MG/ML IV SOLN: 2.0000 mg | INTRAVENOUS | Status: DC | PRN

## 2018-07-09 MED ORDER — LATANOPROST 0.005 % OP SOLN
1.0000 [drp] | Freq: Every day | OPHTHALMIC | Status: DC
  Administered 2018-07-10 – 2018-07-15 (×7): 1 [drp] via OPHTHALMIC
  Filled 2018-07-09: qty 2.5

## 2018-07-09 MED ORDER — TIMOLOL MALEATE 0.5 % OP SOLN
1.0000 [drp] | Freq: Every day | OPHTHALMIC | Status: DC
  Administered 2018-07-10 – 2018-07-16 (×7): 1 [drp] via OPHTHALMIC
  Filled 2018-07-09 (×2): qty 5

## 2018-07-09 MED ORDER — ATORVASTATIN CALCIUM 10 MG PO TABS
10.0000 mg | ORAL_TABLET | Freq: Every day | ORAL | Status: DC
  Administered 2018-07-10: 10 mg via ORAL
  Filled 2018-07-09: qty 1

## 2018-07-09 MED ORDER — SODIUM CHLORIDE 0.9 % IV BOLUS
500.0000 mL | Freq: Once | INTRAVENOUS | Status: AC
  Administered 2018-07-09: 500 mL via INTRAVENOUS

## 2018-07-09 MED ORDER — ONDANSETRON HCL 4 MG PO TABS: 4.0000 mg | ORAL_TABLET | Freq: Four times a day (QID) | ORAL | Status: DC | PRN

## 2018-07-09 MED ORDER — ONDANSETRON HCL 4 MG/2ML IJ SOLN
4.0000 mg | Freq: Once | INTRAMUSCULAR | Status: AC
  Administered 2018-07-09: 4 mg via INTRAVENOUS
  Filled 2018-07-09: qty 2

## 2018-07-09 MED ORDER — SODIUM CHLORIDE 0.9 % IV SOLN
INTRAVENOUS | Status: AC
  Administered 2018-07-10: via INTRAVENOUS

## 2018-07-09 MED ORDER — ACETAMINOPHEN 650 MG RE SUPP: 650.0000 mg | Freq: Four times a day (QID) | RECTAL | Status: DC | PRN

## 2018-07-09 MED ORDER — ACETAMINOPHEN 325 MG PO TABS
650.0000 mg | ORAL_TABLET | Freq: Four times a day (QID) | ORAL | Status: DC | PRN
Start: 2018-07-09 — End: 2018-07-16

## 2018-07-09 MED ORDER — LACTATED RINGERS IV SOLN
INTRAVENOUS | Status: DC
  Administered 2018-07-09: 19:00:00 via INTRAVENOUS

## 2018-07-09 MED ORDER — HYDROCODONE-ACETAMINOPHEN 5-325 MG PO TABS: 1.0000 | ORAL_TABLET | ORAL | Status: DC | PRN

## 2018-07-09 MED ORDER — IOHEXOL 300 MG/ML  SOLN
30.0000 mL | Freq: Once | INTRAMUSCULAR | Status: AC | PRN
Start: 2018-07-09 — End: 2018-07-09
  Administered 2018-07-09: 30 mL via ORAL

## 2018-07-09 NOTE — ED Notes (Signed)
ED TO INPATIENT HANDOFF REPORT  Name/Age/Gender Matthew Winters 82 y.o. male  Code Status   Home/SNF/Other Home  Chief Complaint emesis/dizzy/weak  Level of Care/Admitting Diagnosis ED Disposition    ED Disposition Condition Matthew [facility]ters [facility]: [place] [place] [100102]  Level of Care: Med-Surg [16]  Diagnosis: Adynamic ileus [facility]) [[id number]]  Admitting Physician: Toy Baker [[id number]]  Attending Physician: Toy Baker [[id number]]  Estimated length of stay: 3 - 4 days  Certification:: I certify this patient will need inpatient services for at least 2 midnights  PT Class (Do Not Modify): Inpatient [101]  PT Acc Code (Do Not Modify): Private [1]       Medical History Past Medical History:  Diagnosis Date  . Arthritis   . Cataract   . Glaucoma   . Gout   . History of blood transfusion   . Hyperlipidemia   . Hypertension     Allergies No Known Allergies  IV Location/Drains/Wounds Patient Lines/Drains/Airways Status   Active Line/Drains/Airways    Name:   Placement date:   Placement time:   Site:   Days:   Peripheral IV 07/09/18 Right Antecubital   07/09/18    1236    Antecubital   less than 1   Urethral Catheter Matthew Winters Latex 16 Fr.   07/09/18    1903    Latex   less than 1   Incision 07/27/12 Eye Right   07/27/12    1418     2173   Incision 10/16/13 Lip Other (Comment)   10/16/13    0955     1727          Labs/Imaging Results for orders placed or performed during the Winters encounter of 07/09/18 (from the past 48 hour(s))  Urinalysis, Routine w reflex microscopic     Status: Abnormal   Collection Time: 07/09/18 11:38 AM  Result Value Ref Range   Color, Urine YELLOW YELLOW   APPearance CLEAR CLEAR   Specific Gravity, Urine 1.011 1.005 - 1.030   pH 5.0 5.0 - 8.0   Glucose, UA NEGATIVE NEGATIVE mg/dL   Hgb urine dipstick MODERATE (A) NEGATIVE   Bilirubin Urine NEGATIVE NEGATIVE   Ketones, ur 5 (A) NEGATIVE mg/dL    Protein, ur NEGATIVE NEGATIVE mg/dL   Nitrite NEGATIVE NEGATIVE   Leukocytes, UA NEGATIVE NEGATIVE   RBC / HPF 0-5 0 - 5 RBC/hpf   Bacteria, UA NONE SEEN NONE SEEN   Squamous Epithelial / LPF 0-5 0 - 5   Mucus PRESENT    Hyaline Casts, UA PRESENT     Comment: Performed at [facility], [place] [address], [place],  [address]  Lipase, blood     Status: Abnormal   Collection Time: 07/09/18 12:38 PM  Result Value Ref Range   Lipase 59 (H) 11 - 51 U/L    Comment: Performed at [facility], [place] [address] [place],  [address]  Comprehensive metabolic panel     Status: Abnormal   Collection Time: 07/09/18 12:38 PM  Result Value Ref Range   Sodium 138 135 - 145 mmol/L   Potassium 4.4 3.5 - 5.1 mmol/L   Chloride 105 98 - 111 mmol/L   CO2 21 (L) 22 - 32 mmol/L   Glucose, Bld 83 70 - 99 mg/dL   BUN 54 (H) 8 - 23 mg/dL   Creatinine, Ser 3.42 (H) 0.61 - 1.24 mg/dL   Calcium 9.5 8.9 - 10.3 mg/dL  Total Protein 7.6 6.5 - 8.1 g/dL   Albumin 4.1 3.5 - 5.0 g/dL   AST 126 (H) 15 - 41 U/L   ALT 120 (H) 0 - 44 U/L   Alkaline Phosphatase 99 38 - 126 U/L   Total Bilirubin 1.4 (H) 0.3 - 1.2 mg/dL   GFR calc non Af Amer 16 (L) >60 mL/min   GFR calc Af Amer 18 (L) >60 mL/min    Comment: (NOTE) The eGFR has been calculated using the CKD EPI equation. This calculation has not been validated in all clinical situations. eGFR's persistently <60 mL/min signify possible Chronic Kidney Disease.    Anion gap 12 5 - 15    Comment: Performed at [facility], [place] [address] [place], [place] [address]  CBC     Status: Abnormal   Collection Time: 07/09/18 12:38 PM  Result Value Ref Range   WBC 2.8 (L) 4.0 - 10.5 K/uL   RBC 4.01 (L) 4.22 - 5.81 MIL/uL   Hemoglobin 10.8 (L) 13.0 - 17.0 g/dL   HCT 33.1 (L) 39.0 - 52.0 %   MCV 82.5 78.0 - 100.0 fL   MCH 26.9 26.0 - 34.0 pg   MCHC 32.6 30.0 - 36.0 g/dL   RDW 15.9 (H) 11.5 - 15.5 %    Platelets 171 150 - 400 K/uL    Comment: Performed at [facility], [place] [address] [place], [place] [address]  I-stat troponin, ED     Status: None   Collection Time: 07/09/18  1:40 PM  Result Value Ref Range   Troponin i, poc 0.04 0.00 - 0.08 ng/mL   Comment 3            Comment: Due to the release kinetics of cTnI, a negative result within the first hours of the onset of symptoms does not rule out myocardial infarction with certainty. If myocardial infarction is still suspected, repeat the test at appropriate intervals.    Ct Abdomen Pelvis Wo Contrast  Result Date: 07/09/2018 CLINICAL DATA:  Patient [name], c/o N/V x4 days.Denies abdominal pain, chest pain, SOB, and diarrhea EXAM: CT ABDOMEN AND PELVIS WITHOUT CONTRAST TECHNIQUE: Multidetector CT imaging of the abdomen and pelvis was performed following the standard protocol without IV contrast. COMPARISON:  None. FINDINGS: Lower chest: No acute abnormality. Hepatobiliary: Choose 1 Pancreas: Choose 1 Spleen: Choose 1 Adrenals/Urinary Tract: No adrenal masses. Bilateral low-density renal masses consistent with cysts, largest from the upper pole of the left kidney measuring 8.7 cm. Bilateral renal cortical thinning. Small calcification in the upper pole of the right kidney likely lies on the wall of the upper pole cyst. No convincing collecting system calcifications. No hydronephrosis. Ureters are normal in course and caliber. Bladder is unremarkable. Stomach/Bowel: Stomach is moderately distended. No stomach wall thickening or adjacent inflammation. Small bowel is normal in caliber. There are small bowel air-fluid levels. There is no wall thickening or adjacent inflammation. Colon is normal in caliber. No colonic wall thickening or inflammation. Normal appendix visualized. Vascular/Lymphatic: Aortic atherosclerosis. No enlarged abdominal or pelvic lymph nodes. Reproductive: Prostate enlarged measuring 5.5 x 4.1 cm  transversely. Other: No abdominal wall hernia or abnormality. No abdominopelvic ascites. Musculoskeletal: No fracture or acute finding. No osteoblastic or osteolytic lesions. IMPRESSION: 1. Possible mild adynamic ileus. This is suggested by moderate stomach distension and scattered air-fluid levels within the small bowel. There is no bowel dilation to suggest obstruction. No bowel inflammation. 2. No other evidence of an acute abnormality  within the abdomen or pelvis. 3. Multiple renal cysts with renal cortical thinning. 4. Aortic atherosclerosis. Electronically Signed   By: Lajean Manes M.D.   On: 07/09/2018 16:00   Ct Head Wo Contrast  Result Date: 07/09/2018 CLINICAL DATA:  Dizziness. EXAM: CT HEAD WITHOUT CONTRAST TECHNIQUE: Contiguous axial images were obtained from the base of the skull through the vertex without intravenous contrast. COMPARISON:  None. FINDINGS: Brain: No evidence of acute infarction, hemorrhage, hydrocephalus, extra-axial collection or mass lesion/mass effect. There is age appropriate volume loss. Mild white matter hypoattenuation is noted consistent with chronic microvascular ischemic change. Vascular: No hyperdense vessel or unexpected calcification. Skull: Normal. Negative for fracture or focal lesion. Sinuses/Orbits: Globes and orbits are unremarkable. Visualized sinuses are clear. Other: None. IMPRESSION: 1. No acute intracranial abnormalities. 2. Age-appropriate volume loss. Mild chronic microvascular ischemic change. Electronically Signed   By: Lajean Manes M.D.   On: 07/09/2018 15:52    Pending Labs Unresulted Labs (From admission, onward)    Start     Ordered   07/09/18 2103  Vitamin B12  (Anemia Panel (PNL))  Once,   R     07/09/18 2102   07/09/18 2103  Folate  (Anemia Panel (PNL))  Once,   R     07/09/18 2102   07/09/18 2103  Iron and TIBC  (Anemia Panel (PNL))  Once,   R     07/09/18 2102   07/09/18 2103  Ferritin  (Anemia Panel (PNL))  Once,   R     07/09/18  2102   07/09/18 2103  Reticulocytes  (Anemia Panel (PNL))  Once,   R     07/09/18 2102   07/09/18 2052  CK  Add-on,   R     07/09/18 2051   07/09/18 2052  Magnesium  Add-on,   R     07/09/18 2051   07/09/18 2052  Troponin I  Add-on,   R     07/09/18 2051   Unscheduled  Occult blood card to lab, stool RN will collect  As needed,   R    Question:  Specimen to be collected by?  Answer:  RN will collect   07/09/18 2102   Signed and Held  Magnesium  Tomorrow morning,   R    Comments:  Call MD if <1.5    Signed and Held   Signed and Held  Phosphorus  Tomorrow morning,   R     Signed and Held   Signed and Held  TSH  Once,   R    Comments:  Cancel if already done within 1 month and notify MD    Signed and Held   Signed and Held  Comprehensive metabolic panel  Once,   R    Comments:  Cal MD for K<3.5 or >5.0    Signed and Held   Signed and Held  CBC  Once,   R    Comments:  Call for hg <8.0    Signed and Held          Vitals/Pain Today's Vitals   07/09/18 1700 07/09/18 1730 07/09/18 1900 07/09/18 2127  BP: 138/81 (!) 147/81 137/84 134/79  Pulse: 63 64 71 70  Resp: _0 (!) 21  Temp:      TempSrc:      SpO2: 98% 98% 96% 96%  Weight:      Height:      PainSc:        Isolation Precautions  No active isolations  Medications Medications  lactated ringers infusion ( Intravenous New Bag/Given 07/09/18 1919)  ondansetron (ZOFRAN) injection 4 mg (4 mg Intravenous Given 07/09/18 1332)  sodium chloride 0.9 % bolus 500 mL (0 mLs Intravenous Stopped 07/09/18 1434)  iohexol (OMNIPAQUE) 300 MG/ML solution 30 mL (30 mLs Oral Contrast Given 07/09/18 1358)  lactated ringers bolus 1,000 mL (0 mLs Intravenous Stopped 07/09/18 1810)    Mobility walks with device

## 2018-07-09 NOTE — H&P (Signed)
Matthew Winters:[id number] DOB: 25-Jul-1936 DOA: 07/09/2018     PCP: [name], MD   Outpatient Specialists:     Justin Mend  Patient arrived to ER on 07/09/18 at 1130  Patient coming from:    home Lives  With family    Chief Complaint:  Chief Complaint  Patient presents with  . Emesis  . Dizziness    HPI: Matthew Winters is a 82 y.o. male with medical history significant of CKD, CAD, HLD Remote history of GI bleed, glaucoma, gout, HTN  Presented with vomiting and lightheadedness for the past 4 days no abdominal pain no chest pain no diarrhea no fevers no dysuria. Family states he is unable to keep anything down and started to feel lightlheaded.  Patient himself unable to provide detailed history describe with his dizziness feels like Reports 2-3 days ago his BM was black but unsure about details   Regarding pertinent Chronic problems: Last cath in 2012 revealed nonobstructive coronary artery  disease with well preserved ejection fraction (60%) with mild inferior  hypokinesis.  Specifically, there was a 30% left anterior descending lesion;  25% proximal ramus intermedius, 30% proximal obtuse marginal #1 and 40%  proximal and mid right coronary artery disease.   While in ER: CT head non acute Found to have adynamic ileus on CT Noted to have >600in his bladder Foley was placed Cr from 5 years ago 2.5 now 3.5  The following Work up has been ordered so far:  Orders Placed This Encounter  Procedures  . CT Head Wo Contrast  . CT Abdomen Pelvis Wo Contrast  . Lipase, blood  . Comprehensive metabolic panel  . CBC  . Urinalysis, Routine w reflex microscopic  . Diet NPO time specified  . Saline Lock IV, Maintain IV access  . Orthostatic vital signs  . Fluid/PO Challenge  . Bladder scan  . Bladder scan  . Insert foley catheter  . Consult to hospitalist  . I-stat troponin, ED  . EKG 12-Lead  . ED EKG     Following Medications were ordered in ER: Medications  lactated  ringers infusion ( Intravenous New Bag/Given 07/09/18 1919)  ondansetron (ZOFRAN) injection 4 mg (4 mg Intravenous Given 07/09/18 1332)  sodium chloride 0.9 % bolus 500 mL (0 mLs Intravenous Stopped 07/09/18 1434)  iohexol (OMNIPAQUE) 300 MG/ML solution 30 mL (30 mLs Oral Contrast Given 07/09/18 1358)  lactated ringers bolus 1,000 mL (0 mLs Intravenous Stopped 07/09/18 1810)    Significant initial  Findings: Abnormal Labs Reviewed  LIPASE, BLOOD - Abnormal; Notable for the following components:      Result Value   Lipase 59 (*)    All other components within normal limits  COMPREHENSIVE METABOLIC PANEL - Abnormal; Notable for the following components:   CO2 21 (*)    BUN 54 (*)    Creatinine, Ser 3.42 (*)    AST 126 (*)    ALT 120 (*)    Total Bilirubin 1.4 (*)    GFR calc non Af Amer 16 (*)    GFR calc Af Amer 18 (*)    All other components within normal limits  CBC - Abnormal; Notable for the following components:   WBC 2.8 (*)    RBC 4.01 (*)    Hemoglobin 10.8 (*)    HCT 33.1 (*)    RDW 15.9 (*)    All other components within normal limits  URINALYSIS, ROUTINE W REFLEX MICROSCOPIC - Abnormal; Notable for the following  components:   Hgb urine dipstick MODERATE (*)    Ketones, ur 5 (*)    All other components within normal limits   Trop 0.04 Lipase 59  Na 138 K 4.4  Cr    Up from baseline see below Lab Results  Component Value Date   CREATININE 3.42 (H) 07/09/2018   CREATININE 2.50 (H) 10/16/2013   CREATININE 2.17 (H) 07/20/2012      WBC 2.8  HG/HCT    Dow from baseline of 14.3 5 years ago    Component Value Date/Time   HGB 10.8 (L) 07/09/2018 1238   HCT 33.1 (L) 07/09/2018 1238       Troponin (Point of Care Test) Recent Labs    07/09/18 1340  TROPIPOC 0.04       BNP (last 3 results) No results for input(s): BNP in the last 8760 hours.  ProBNP (last 3 results) No results for input(s): PROBNP in the last 8760 hours.  Lactic Acid, Venous No  results found for: LATICACIDVEN    UA  no evidence of UTI     CT HEAD  NON acute    CTabd/pelvis - mild adynamic ileus.  ECG:  Personally reviewed by me showing: HR : 86 Rhythm: NSR,   no evidence of ischemic changes QTC 466      ED Triage Vitals  Enc Vitals Group     BP 07/09/18 1134 119/87     Pulse Rate 07/09/18 1134 85     Resp 07/09/18 1134 17     Temp 07/09/18 1134 98.4 F (36.9 C)     Temp Source 07/09/18 1134 Oral     SpO2 07/09/18 1134 97 %     Weight 07/09/18 1223 178 lb 2.1 oz (80.8 kg)     Height 07/09/18 1223 5\' 6"  (1.676 m)     Head Circumference --      Peak Flow --      Pain Score 07/09/18 1137 0     Pain Loc --      Pain Edu? --      Excl. in [place]? --   TMAX(24)@       Latest  Blood pressure 137/84, pulse 71, temperature 98.4 F (36.9 C), temperature source Oral, resp. rate 16, height 5\' 6"  (1.676 m), weight 80.8 kg, SpO2 96 %.   Hospitalist was called for admission for dehydration   Review of Systems:    Pertinent positives include:  fatigue, nausea, vomiting  Constitutional:  No weight loss, night sweats, Fevers, chills, weight loss  HEENT:  No headaches, Difficulty swallowing,Tooth/dental problems,Sore throat,  No sneezing, itching, ear ache, nasal congestion, post nasal drip,  Cardio-vascular:  No chest pain, Orthopnea, PND, anasarca, dizziness, palpitations.no Bilateral lower extremity swelling  GI:  No heartburn, indigestion, abdominal pain,, diarrhea, change in bowel habits, loss of appetite, melena, blood in stool, hematemesis Resp:  no shortness of breath at rest. No dyspnea on exertion, No excess mucus, no productive cough, No non-productive cough, No coughing up of blood.No change in color of mucus.No wheezing. Skin:  no rash or lesions. No jaundice GU:  no dysuria, change in color of urine, no urgency or frequency. No straining to urinate.  No flank pain.  Musculoskeletal:  No joint pain or no joint swelling. No decreased  range of motion. No back pain.  Psych:  No change in mood or affect. No depression or anxiety. No memory loss.  Neuro: no localizing neurological complaints, no tingling, no weakness, no double  vision, no gait abnormality, no slurred speech, no confusion  All systems reviewed and apart from [place] all are negative  Past Medical History:   Past Medical History:  Diagnosis Date  . Arthritis   . Cataract   . Glaucoma   . Gout   . History of blood transfusion   . Hyperlipidemia   . Hypertension       Past Surgical History:  Procedure Laterality Date  . CATARACT EXTRACTION W/PHACO  07/27/2012   Procedure: CATARACT EXTRACTION PHACO AND INTRAOCULAR LENS PLACEMENT (IOC);  Surgeon: Marylynn Pearson, MD;  Location: [facility];  Service: Ophthalmology;  Laterality: Right;  . COLONOSCOPY    . COLONOSCOPY W/ POLYPECTOMY    . EYE SURGERY  2012   glaucoma surgery - right  . MULTIPLE EXTRACTIONS WITH ALVEOLOPLASTY N/A 10/16/2013   Procedure: MULTIPLE EXTRACION WITH ALVEOLOPLASTY;  Surgeon: Gae Bon, DDS;  Location: [facility];  Service: Oral Surgery;  Laterality: N/A;  . POLYPECTOMY      Social History:  Ambulatory  Independently      reports that he has quit smoking. He has never used smokeless tobacco. He reports that he does not drink alcohol or use drugs.    Family History:   Family History  Problem Relation Age of Onset  . Heart disease Mother   . Colon cancer Neg Hx   . Colon polyps Neg Hx   . Rectal cancer Neg Hx   . Stomach cancer Neg Hx     Allergies: No Known Allergies   Prior to Admission medications   Medication Sig Start Date End Date Taking? Authorizing Provider  allopurinol (ZYLOPRIM) 100 MG tablet Take 100 mg by mouth daily as needed (gout flares).    Yes [provider]  amLODipine (NORVASC) 5 MG tablet Take 5 mg by mouth daily. 06/27/18  Yes [provider]  atorvastatin (LIPITOR) 10 MG tablet Take 10 mg by mouth daily.   Yes  [provider]  AZOPT 1 % ophthalmic suspension Place 1 drop into both eyes 3 (three) times daily. 07/01/18  Yes [provider]  colchicine 0.6 MG tablet Take 0.6 mg by mouth daily as needed (gout flares).    Yes [provider]  dorzolamide (TRUSOPT) 2 % ophthalmic solution Place 1 drop into both eyes 3 (three) times daily. 06/17/18  Yes [provider]  RESTASIS 0.05 % ophthalmic emulsion Place 1 drop into both eyes 2 (two) times daily. 06/22/18  Yes [provider]  timolol (TIMOPTIC) 0.5 % ophthalmic solution Place 1 drop into both eyes daily. 07/01/18  Yes [provider]   Physical Exam: Blood pressure 137/84, pulse 71, temperature 98.4 F (36.9 C), temperature source Oral, resp. rate 16, height 5\' 6"  (1.676 m), weight 80.8 kg, SpO2 96 %. 1. General:  in No Acute distress   Chronically ill  -appearing 2. Psychological: Alert and   Oriented 3. Head/ENT:    Dry Mucous Membranes                          Head Non traumatic, neck supple                           Poor Dentition 4. SKIN:   decreased Skin turgor,  Skin clean Dry and intact no rash 5. Heart: Regular rate and rhythm no  Murmur, no Rub or gallop 6. Lungs:  no wheezes or crackles   7. Abdomen: Soft,  non-tender, Non distended   obese  bowel sounds present 8. Lower extremities: no clubbing, cyanosis, or  edema 9. Neurologically Grossly intact, moving all 4 extremities equally 10. MSK: Normal range of motion   LABS:     Recent Labs  Lab 07/09/18 1238  WBC 2.8*  HGB 10.8*  HCT 33.1*  MCV 82.5  PLT 623   Basic Metabolic Panel: Recent Labs  Lab 07/09/18 1238  NA 138  K 4.4  CL 105  CO2 21*  GLUCOSE 83  BUN 54*  CREATININE 3.42*  CALCIUM 9.5      Recent Labs  Lab 07/09/18 1238  AST 126*  ALT 120*  ALKPHOS 99  BILITOT 1.4*  PROT 7.6  ALBUMIN 4.1   Recent Labs  Lab 07/09/18 1238  LIPASE 59*   No results for input(s): AMMONIA in the last 168  hours.    HbA1C: No results for input(s): HGBA1C in the last 72 hours. CBG: No results for input(s): GLUCAP in the last 168 hours.    Urine analysis:    Component Value Date/Time   COLORURINE YELLOW 07/09/2018 1138   APPEARANCEUR CLEAR 07/09/2018 1138   LABSPEC 1.011 07/09/2018 1138   PHURINE 5.0 07/09/2018 1138   GLUCOSEU NEGATIVE 07/09/2018 1138   HGBUR MODERATE (A) 07/09/2018 1138   BILIRUBINUR NEGATIVE 07/09/2018 1138   KETONESUR 5 (A) 07/09/2018 1138   PROTEINUR NEGATIVE 07/09/2018 1138   UROBILINOGEN 0.2 02/26/2008 1900   NITRITE NEGATIVE 07/09/2018 1138   LEUKOCYTESUR NEGATIVE 07/09/2018 1138       Cultures: No results found for: SDES, SPECREQUEST, CULT, REPTSTATUS   Radiological Exams on Admission: Ct Abdomen Pelvis Wo Contrast  Result Date: 07/09/2018 CLINICAL DATA:  Patient [name], c/o N/V x4 days.Denies abdominal pain, chest pain, SOB, and diarrhea EXAM: CT ABDOMEN AND PELVIS WITHOUT CONTRAST TECHNIQUE: Multidetector CT imaging of the abdomen and pelvis was performed following the standard protocol without IV contrast. COMPARISON:  None. FINDINGS: Lower chest: No acute abnormality. Hepatobiliary: Choose 1 Pancreas: Choose 1 Spleen: Choose 1 Adrenals/Urinary Tract: No adrenal masses. Bilateral low-density renal masses consistent with cysts, largest from the upper pole of the left kidney measuring 8.7 cm. Bilateral renal cortical thinning. Small calcification in the upper pole of the right kidney likely lies on the wall of the upper pole cyst. No convincing collecting system calcifications. No hydronephrosis. Ureters are normal in course and caliber. Bladder is unremarkable. Stomach/Bowel: Stomach is moderately distended. No stomach wall thickening or adjacent inflammation. Small bowel is normal in caliber. There are small bowel air-fluid levels. There is no wall thickening or adjacent inflammation. Colon is normal in caliber. No colonic wall thickening or inflammation.  Normal appendix visualized. Vascular/Lymphatic: Aortic atherosclerosis. No enlarged abdominal or pelvic lymph nodes. Reproductive: Prostate enlarged measuring 5.5 x 4.1 cm transversely. Other: No abdominal wall hernia or abnormality. No abdominopelvic ascites. Musculoskeletal: No fracture or acute finding. No osteoblastic or osteolytic lesions. IMPRESSION: 1. Possible mild adynamic ileus. This is suggested by moderate stomach distension and scattered air-fluid levels within the small bowel. There is no bowel dilation to suggest obstruction. No bowel inflammation. 2. No other evidence of an acute abnormality within the abdomen or pelvis. 3. Multiple renal cysts with renal cortical thinning. 4. Aortic atherosclerosis. Electronically Signed   By: Lajean Manes M.D.   On: 07/09/2018 16:00   Ct Head Wo Contrast  Result Date: 07/09/2018 CLINICAL DATA:  Dizziness. EXAM: CT HEAD WITHOUT  CONTRAST TECHNIQUE: Contiguous axial images were obtained from the base of the skull through the vertex without intravenous contrast. COMPARISON:  None. FINDINGS: Brain: No evidence of acute infarction, hemorrhage, hydrocephalus, extra-axial collection or mass lesion/mass effect. There is age appropriate volume loss. Mild white matter hypoattenuation is noted consistent with chronic microvascular ischemic change. Vascular: No hyperdense vessel or unexpected calcification. Skull: Normal. Negative for fracture or focal lesion. Sinuses/Orbits: Globes and orbits are unremarkable. Visualized sinuses are clear. Other: None. IMPRESSION: 1. No acute intracranial abnormalities. 2. Age-appropriate volume loss. Mild chronic microvascular ischemic change. Electronically Signed   By: Lajean Manes M.D.   On: 07/09/2018 15:52    Chart has been reviewed    Assessment/Plan  Almir Botts is a 82 y.o. male with medical history significant of CKD, CAD, HLD Remote history of GI bleed, glaucoma, gout, HTN  Admitted for dehydration notd to have  progressive anemia unclear duration   Present on Admission:  . Acute urinary retention - sp Foley catheter, will need to follow up with Urology if fails voiding trial. Start Flomax and monitor for orthostatic hypotension . AKI (acute kidney injury) ([place]) - last Cr 5 years ago, unsure if acute worsening versus chronic progression, Place Foley catheter given urinary retention follow gentle rehydration obtain urine electrolytes . Dehydration rehydrate and follow kidney status . Adynamic ileus ([place]) -currently not vomiting keep n.p.o. for tonight and observe if vomiting resumes may need placement supportive management for tonight . Essential hypertension- stable continue Norvasc . CKD (chronic kidney disease), stage III (HCC)'s avoid nephrotoxic medications monitor kidney function Anemia-patient states that few days ago he may have had black stool we will obtain Hemoccult stool and anemia panel Leukopenia -we will follow, Obtain CC wdiff unclear if acute or chronic. If persists may need further workup.  Debility will have PT /ot eval prior to discharge  Other plan as per orders.  DVT prophylaxis:   SCD   Code Status:  FULL CODE  as per patient   I had personally discussed CODE STATUS with patient and family  Family Communication:   Family not  at  Bedside    Disposition Plan:       To home once workup is complete and patient is stable                    Would benefit from PT/OT eval prior to [place] called: none   Admission status:   inpatient       Level of care    medical floor           Toy Baker 07/09/2018, 9:37 PM    [facility]  Pager [phone]   after 2 AM please page floor coverage PA If 7AM-7PM, please contact the day team taking care of the patient  [email or web address]  Password [email or web address]

## 2018-07-09 NOTE — ED Notes (Signed)
AWARE OF NEED FOR URINE SAMPLE 

## 2018-07-09 NOTE — ED Notes (Signed)
WATER GIVEN

## 2018-07-09 NOTE — ED Provider Notes (Signed)
5:00 pm Care transferred to me.  The patient feels dizzy when doing orthostatics, even after he has already received 1 L IV fluids.  He will be given a second liter.  I have encouraged him to urinate as this will be important to help rule out UTI.  CT shows adynamic ileus but no other significant findings.  Second liter IV fluids ordered.  6:48 PM bladder scan shows greater than 500 mL of urine in bladder.  He is unable to urinate.  He will have a Foley placed for urinary retention.  This could be contributing to his worsening creatinine.  Given he is required 2 L IV fluids and was positive on orthostatics I think it be reasonable to admit him for overnight observation and continued fluids.  Dr. Roel Cluck to admit.   Sherwood Gambler, MD 07/09/18 2012

## 2018-07-09 NOTE — ED Provider Notes (Signed)
[facility] DEPT Provider Note   CSN: [id number] Arrival date & time: 07/09/18  1130     History   Chief Complaint Chief Complaint  Patient presents with  . Emesis  . Dizziness    HPI Matthew Winters is a 82 y.o. male.  The history is provided by the patient and a relative. No language interpreter was used.  Emesis    Dizziness  Associated symptoms: vomiting    Matthew Winters is a 82 y.o. male who presents to the Emergency Department complaining of dizziness, vomiting. Level 5 caveat due to patient being a poor and vague historian. Most of the history is provided by the family. Patient states that he is been feeling poorly for the last few days with dizziness. Family reports vomiting with every meal for the last several days. No reports of fever, headache, chest pain, abdominal pain, diarrhea, constipation, dysuria. He is unable to specify what his dizziness feels like. No prior similar symptoms. He does have a history of chronic otitis media in the right as well as hypertension, hyperlipidemia. He lives at home with his daughter. Past Medical History:  Diagnosis Date  . Arthritis   . Cataract   . Glaucoma   . Gout   . History of blood transfusion   . Hyperlipidemia   . Hypertension     There are no active problems to display for this patient.   Past Surgical History:  Procedure Laterality Date  . CATARACT EXTRACTION W/PHACO  07/27/2012   Procedure: CATARACT EXTRACTION PHACO AND INTRAOCULAR LENS PLACEMENT (IOC);  Surgeon: Marylynn Pearson, MD;  Location: [facility];  Service: Ophthalmology;  Laterality: Right;  . COLONOSCOPY    . COLONOSCOPY W/ POLYPECTOMY    . EYE SURGERY  2012   glaucoma surgery - right  . MULTIPLE EXTRACTIONS WITH ALVEOLOPLASTY N/A 10/16/2013   Procedure: MULTIPLE EXTRACION WITH ALVEOLOPLASTY;  Surgeon: Gae Bon, DDS;  Location: [facility];  Service: Oral Surgery;  Laterality: N/A;  . POLYPECTOMY          Home  Medications    Prior to Admission medications   Medication Sig Start Date End Date Taking? Authorizing Provider  allopurinol (ZYLOPRIM) 100 MG tablet Take 100 mg by mouth daily as needed (gout flares).    Yes [provider]  amLODipine (NORVASC) 5 MG tablet Take 5 mg by mouth daily. 06/27/18  Yes [provider]  atorvastatin (LIPITOR) 10 MG tablet Take 10 mg by mouth daily.   Yes [provider]  AZOPT 1 % ophthalmic suspension Place 1 drop into both eyes 3 (three) times daily. 07/01/18  Yes [provider]  colchicine 0.6 MG tablet Take 0.6 mg by mouth daily as needed (gout flares).    Yes [provider]  dorzolamide (TRUSOPT) 2 % ophthalmic solution Place 1 drop into both eyes 3 (three) times daily. 06/17/18  Yes [provider]  RESTASIS 0.05 % ophthalmic emulsion Place 1 drop into both eyes 2 (two) times daily. 06/22/18  Yes [provider]  timolol (TIMOPTIC) 0.5 % ophthalmic solution Place 1 drop into both eyes daily. 07/01/18  Yes [provider]    Family History Family History  Problem Relation Age of Onset  . Heart disease Mother   . Colon cancer Neg Hx   . Colon polyps Neg Hx   . Rectal cancer Neg Hx   . Stomach cancer Neg Hx     Social History Social History  Tobacco Use  . Smoking status: Former Research scientist (life sciences)  . Smokeless tobacco: Never Used  Substance Use Topics  . Alcohol use: No    Alcohol/week: 0.0 standard drinks  . Drug use: No     Allergies   Patient has no known allergies.   Review of Systems Review of Systems  Gastrointestinal: Positive for vomiting.  Neurological: Positive for dizziness.  All other systems reviewed and are negative.    Physical Exam Updated Vital Signs BP 125/82   Pulse 77   Temp 98.4 F (36.9 C) (Oral)   Resp 14   Ht 5\' 6"  (1.676 m)   Wt 80.8 kg   SpO2 100%   BMI 28.75 kg/m   Physical Exam  Constitutional: He is oriented to person, place, and time. He  appears well-developed and well-nourished.  HENT:  Head: Normocephalic and atraumatic.  Mouth/Throat: Oropharynx is clear and moist.  Left TM clear. Right TM with chronic appearing perforation, no erythema or exudates  Eyes: Pupils are equal, round, and reactive to light. EOM are normal.  Cardiovascular: Normal rate and regular rhythm.  No murmur heard. Pulmonary/Chest: Effort normal and breath sounds normal. No respiratory distress.  Abdominal: Soft. There is no tenderness. There is no rebound and no guarding.  Musculoskeletal: He exhibits no edema or tenderness.  Neurological: He is alert and oriented to person, place, and time.  Slow to answer questions. Five out of five strength in all four extremities with sensation to light touch intact in all four extremities. No asymmetry of facial muscles. No pronated or drift.  Skin: Skin is warm and dry.  Psychiatric: He has a normal mood and affect. His behavior is normal.  Nursing note and vitals reviewed.    ED Treatments / Results  Labs (all labs ordered are listed, but only abnormal results are displayed) Labs Reviewed  LIPASE, BLOOD - Abnormal; Notable for the following components:      Result Value   Lipase 59 (*)    All other components within normal limits  COMPREHENSIVE METABOLIC PANEL - Abnormal; Notable for the following components:   CO2 21 (*)    BUN 54 (*)    Creatinine, Ser 3.42 (*)    AST 126 (*)    ALT 120 (*)    Total Bilirubin 1.4 (*)    GFR calc non Af Amer 16 (*)    GFR calc Af Amer 18 (*)    All other components within normal limits  CBC - Abnormal; Notable for the following components:   WBC 2.8 (*)    RBC 4.01 (*)    Hemoglobin 10.8 (*)    HCT 33.1 (*)    RDW 15.9 (*)    All other components within normal limits  URINALYSIS, ROUTINE W REFLEX MICROSCOPIC  I-STAT TROPONIN, ED    EKG EKG Interpretation  Date/Time:  Saturday July 09 2018 11:40:00 EDT Ventricular Rate:  84 PR Interval:    QRS  Duration: 109 QT Interval:  394 QTC Calculation: 466 R Axis:   -12 Text Interpretation:  Sinus rhythm Confirmed by Quintella Reichert [phone]) on 07/09/2018 1:04:38 PM   Radiology No results found.  Procedures Procedures (including critical care time)  Medications Ordered in ED Medications  ondansetron (ZOFRAN) injection 4 mg (4 mg Intravenous Given 07/09/18 1332)  sodium chloride 0.9 % bolus 500 mL (500 mLs Intravenous New Bag/Given 07/09/18 1333)  iohexol (OMNIPAQUE) 300 MG/ML solution 30 mL (30 mLs Oral Contrast Given 07/09/18 1358)  Initial Impression / Assessment and Plan / ED Course  I have reviewed the triage vital signs and the nursing notes.  Pertinent labs & imaging results that were available during my care of the patient were reviewed by me and considered in my medical decision making (see chart for details).     Patient here for evaluation of nausea, dizziness and vomiting for the last few days. He is non-toxic appearing on examination with no focal neurologic deficits. He has difficulty explaining his dizziness. Labs demonstrate mild elevation is creatinine compared to priors. Will provide IV fluids. Plan to obtain CT head to rule out chronic subdural or mass lesion. Plan to obtain CT abdomen and pelvis to rule out obstruction. Patient care transferred pending imaging as well as urinalysis.  Final Clinical Impressions(s) / ED Diagnoses   Final diagnoses:  None    ED Discharge Orders    None       Quintella Reichert, MD 07/09/18 [phone]

## 2018-07-09 NOTE — ED Notes (Signed)
ED Provider at bedside. [place] 

## 2018-07-09 NOTE — ED Notes (Signed)
Awaiting for nurse on floor ,  Charge Nurse and [facility] aware.

## 2018-07-09 NOTE — ED Notes (Signed)
ED Provider at bedside. EDP REES

## 2018-07-09 NOTE — ED Triage Notes (Signed)
Patient [name], c/o N/V x4 days. Patient also c/o dizziness. Denies abdominal pain, chest pain, SOB, and diarrhea.

## 2018-07-10 ENCOUNTER — Inpatient Hospital Stay (HOSPITAL_COMMUNITY): Payer: Medicare Other

## 2018-07-10 ENCOUNTER — Encounter (HOSPITAL_COMMUNITY): Payer: Self-pay

## 2018-07-10 LAB — COMPREHENSIVE METABOLIC PANEL
ALBUMIN: 3.3 g/dL — AB (ref 3.5–5.0)
ALK PHOS: 82 U/L (ref 38–126)
ALT: 101 U/L — ABNORMAL HIGH (ref 0–44)
ANION GAP: 9 (ref 5–15)
AST: 106 U/L — ABNORMAL HIGH (ref 15–41)
BILIRUBIN TOTAL: 1.2 mg/dL (ref 0.3–1.2)
BUN: 39 mg/dL — ABNORMAL HIGH (ref 8–23)
CALCIUM: 8.8 mg/dL — AB (ref 8.9–10.3)
CO2: 21 mmol/L — ABNORMAL LOW (ref 22–32)
Chloride: 106 mmol/L (ref 98–111)
Creatinine, Ser: 2.5 mg/dL — ABNORMAL HIGH (ref 0.61–1.24)
GFR calc non Af Amer: 23 mL/min — ABNORMAL LOW (ref >60)
GFR, EST AFRICAN AMERICAN: 26 mL/min — AB (ref >60)
GLUCOSE: 70 mg/dL (ref 70–99)
Potassium: 4.1 mmol/L (ref 3.5–5.1)
Sodium: 136 mmol/L (ref 135–145)
TOTAL PROTEIN: 6.2 g/dL — AB (ref 6.5–8.1)

## 2018-07-10 LAB — CBC WITH DIFFERENTIAL/PLATELET
BASOS ABS: 0 10*3/uL (ref 0.0–0.1)
BASOS PCT: 0 %
EOS PCT: 1 %
Eosinophils Absolute: 0 10*3/uL (ref 0.0–0.7)
HEMATOCRIT: 30.1 % — AB (ref 39.0–52.0)
Hemoglobin: 9.9 g/dL — ABNORMAL LOW (ref 13.0–17.0)
Lymphocytes Relative: 23 %
Lymphs Abs: 1 10*3/uL (ref 0.7–4.0)
MCH: 27.2 pg (ref 26.0–34.0)
MCHC: 32.9 g/dL (ref 30.0–36.0)
MCV: 82.7 fL (ref 78.0–100.0)
MONO ABS: 0.5 10*3/uL (ref 0.1–1.0)
Monocytes Relative: 11 %
Neutro Abs: 2.9 10*3/uL (ref 1.7–7.7)
Neutrophils Relative %: 65 %
PLATELETS: 125 10*3/uL — AB (ref 150–400)
RBC: 3.64 MIL/uL — ABNORMAL LOW (ref 4.22–5.81)
RDW: 15.8 % — AB (ref 11.5–15.5)
WBC: 4.4 10*3/uL (ref 4.0–10.5)

## 2018-07-10 LAB — PHOSPHORUS: PHOSPHORUS: 3.1 mg/dL (ref 2.5–4.6)

## 2018-07-10 LAB — TSH: TSH: 2.455 u[IU]/mL (ref 0.350–4.500)

## 2018-07-10 LAB — MAGNESIUM: Magnesium: 2 mg/dL (ref 1.7–2.4)

## 2018-07-10 LAB — TROPONIN I: Troponin I: 0.04 ng/mL (ref <0.03)

## 2018-07-10 MED ORDER — DEXTROSE-NACL 5-0.45 % IV SOLN
INTRAVENOUS | Status: DC
  Administered 2018-07-10 – 2018-07-12 (×5): via INTRAVENOUS

## 2018-07-10 MED ORDER — SODIUM CHLORIDE 0.9 % IV SOLN: INTRAVENOUS | Status: DC

## 2018-07-10 NOTE — Progress Notes (Signed)
MD notified via text page regarding expired NS order.

## 2018-07-10 NOTE — Progress Notes (Signed)
Per pharmacy waiting for Timolol from [facility].

## 2018-07-10 NOTE — Discharge Summary (Deleted)
PROGRESS NOTE  Matthew Winters KGY:[id number] DOB: [birth year] DOA: 07/09/2018 PCP: [name], MD  HPI/Recap of past 52 hours: 82 year old male with medical history significant for CKD, CAD, hypertension, gout, who was brought in by family complaining of vomiting and dizziness for the past 4 days.  Patient also reported poor appetite, and has not been able to keep anything down for those days.  Patient denied any abdominal pain, fever/chills, diarrhea, chest pain, dysuria.  In the ED, pt was noted to be orthostatic positive, CT head unremarkable, CT abdomen/pelvis showed adynamic ileus.  Patient was also noted to have some AKI and urinary retention, Foley was placed in the ED.  Patient admitted for further management   Today, patient denies any further vomiting, still reports poor appetite "states nothing tastes good", denies any abdominal pain, chest pain, fever/chills.  Assessment/Plan: Active Problems:   AKI (acute kidney injury) ([place])   Dehydration   Adynamic ileus (HCC)   Essential hypertension   CKD (chronic kidney disease), stage III (HCC)   Acute urinary retention   Adynamic ileus On presentation, complained of vomiting, poor appetite Afebrile, no leukocytosis LFTs, lipase noted to be elevated, will trend CT abdomen pelvis showed possible mild adynamic ileus. This is suggested by moderate stomach distension and scattered air-fluid levels within the small bowel. There is no bowel dilation to suggest obstruction. No bowel inflammation Will repeat Abd xray today Continue N.p.o., continue IV fluids  AKI on CKD stage 3 Improving Unknown recent baseline, as last Cr 2.5 in 2014 On admission Cr 3.42-->2.5 Likely due to poor oral intake Vs obstructive uropathy UA negative Continue gentle hydration Avoid nephrotoxics, renally dose meds Daily CMP  Acute urinary retention Noted to be retaining urine in ED S/P foley, will start voiding trial Started on flomax Monitor  closely  Orthostatic positive  + On admission, likely due to poor oral intake/hydration Daily orthostatics Continue IVF  Elevated LFTs Down-trending, will trend Unknown etiology Hepatitis panel pending CT abdomen/pelvis: No abnormality of the liver mentioned Monitor  Anemia of CKD Unknown baseline, no signs of bleeding FOBT negative Will monitor, daily cbc  Elevated troponin/Hx of CAD Chest pain free Flat trend, EKG NSR Monitor  HTN BP soft Hold home norvasc  Gout  Continue home meds     Code Status: Full  Family Communication: None at bedside  Disposition Plan: Back home once work up complete   Consultants:  None   Procedures:  None  Antimicrobials:  None   DVT prophylaxis:  SCDs   Objective: Vitals:   07/10/18 0616 07/10/18 1024 07/10/18 1025 07/10/18 1027  BP: 107/69 121/74 114/81 (!) 89/67  Pulse: 79 69 73 (!) 105  Resp: 20 16 20 18   Temp: 98.1 F (36.7 C) 98.7 F (37.1 C)    TempSrc:  Oral    SpO2: 100% 98% 98% 99%  Weight:      Height:        Intake/Output Summary (Last 24 hours) at 07/10/2018 1052 Last data filed at 07/10/2018 1026 Gross per 24 hour  Intake 3753.15 ml  Output 2211 ml  Net 1542.15 ml   Filed Weights   07/09/18 1223  Weight: 80.8 kg    Exam:   General: NAD, chronically ill appearing  Cardiovascular: S1, S2 present  Respiratory: CTAB   Abdomen: Soft  Musculoskeletal: No pedal edema bilaterally  Skin: Normal  Psychiatry: Normal mood   Data Reviewed: CBC: Recent Labs  Lab 07/09/18 1238 07/09/18 2200 07/10/18 0519  WBC 2.8*  3.2* 4.4  NEUTROABS  --  1.9 2.9  HGB 10.8* 10.6* 9.9*  HCT 33.1* 32.2* 30.1*  MCV 82.5 83.4 82.7  PLT 171 130* 099*   Basic Metabolic Panel: Recent Labs  Lab 07/09/18 1238 07/09/18 2103 07/10/18 0519  NA 138  --  136  K 4.4  --  4.1  CL 105  --  106  CO2 21*  --  21*  GLUCOSE 83  --  70  BUN 54*  --  39*  CREATININE 3.42*  --  2.50*  CALCIUM 9.5  --   8.8*  MG  --  2.2 2.0  PHOS  --   --  3.1   GFR: Estimated Creatinine Clearance: 23.5 mL/min (A) (by C-G formula based on SCr of 2.5 mg/dL (H)). Liver Function Tests: Recent Labs  Lab 07/09/18 1238 07/10/18 0519  AST 126* 106*  ALT 120* 101*  ALKPHOS 99 82  BILITOT 1.4* 1.2  PROT 7.6 6.2*  ALBUMIN 4.1 3.3*   Recent Labs  Lab 07/09/18 1238  LIPASE 59*   No results for input(s): AMMONIA in the last 168 hours. Coagulation Profile: No results for input(s): INR, PROTIME in the last 168 hours. Cardiac Enzymes: Recent Labs  Lab 07/09/18 2103  CKTOTAL 485*  TROPONINI 0.05*   BNP (last 3 results) No results for input(s): PROBNP in the last 8760 hours. HbA1C: No results for input(s): HGBA1C in the last 72 hours. CBG: No results for input(s): GLUCAP in the last 168 hours. Lipid Profile: No results for input(s): CHOL, HDL, LDLCALC, TRIG, CHOLHDL, LDLDIRECT in the last 72 hours. Thyroid Function Tests: Recent Labs    07/10/18 0519  TSH 2.455   Anemia Panel: Recent Labs    07/09/18 2103  VITAMINB12 964*  FOLATE 7.5  FERRITIN 871*  TIBC 184*  IRON 96  RETICCTPCT 1.1   Urine analysis:    Component Value Date/Time   COLORURINE YELLOW 07/09/2018 1138   APPEARANCEUR CLEAR 07/09/2018 1138   LABSPEC 1.011 07/09/2018 1138   PHURINE 5.0 07/09/2018 1138   GLUCOSEU NEGATIVE 07/09/2018 1138   HGBUR MODERATE (A) 07/09/2018 1138   BILIRUBINUR NEGATIVE 07/09/2018 1138   KETONESUR 5 (A) 07/09/2018 1138   PROTEINUR NEGATIVE 07/09/2018 1138   UROBILINOGEN 0.2 02/26/2008 1900   NITRITE NEGATIVE 07/09/2018 1138   LEUKOCYTESUR NEGATIVE 07/09/2018 1138   Sepsis Labs: @LABRCNTIP (procalcitonin:4,lacticidven:4)  )No results found for this or any previous visit (from the past 240 hour(s)).    Studies: Ct Abdomen Pelvis Wo Contrast  Result Date: 07/09/2018 CLINICAL DATA:  Patient [name], c/o N/V x4 days.Denies abdominal pain, chest pain, SOB, and diarrhea EXAM: CT  ABDOMEN AND PELVIS WITHOUT CONTRAST TECHNIQUE: Multidetector CT imaging of the abdomen and pelvis was performed following the standard protocol without IV contrast. COMPARISON:  None. FINDINGS: Lower chest: No acute abnormality. Hepatobiliary: Choose 1 Pancreas: Choose 1 Spleen: Choose 1 Adrenals/Urinary Tract: No adrenal masses. Bilateral low-density renal masses consistent with cysts, largest from the upper pole of the left kidney measuring 8.7 cm. Bilateral renal cortical thinning. Small calcification in the upper pole of the right kidney likely lies on the wall of the upper pole cyst. No convincing collecting system calcifications. No hydronephrosis. Ureters are normal in course and caliber. Bladder is unremarkable. Stomach/Bowel: Stomach is moderately distended. No stomach wall thickening or adjacent inflammation. Small bowel is normal in caliber. There are small bowel air-fluid levels. There is no wall thickening or adjacent inflammation. Colon is normal in caliber. No colonic  wall thickening or inflammation. Normal appendix visualized. Vascular/Lymphatic: Aortic atherosclerosis. No enlarged abdominal or pelvic lymph nodes. Reproductive: Prostate enlarged measuring 5.5 x 4.1 cm transversely. Other: No abdominal wall hernia or abnormality. No abdominopelvic ascites. Musculoskeletal: No fracture or acute finding. No osteoblastic or osteolytic lesions. IMPRESSION: 1. Possible mild adynamic ileus. This is suggested by moderate stomach distension and scattered air-fluid levels within the small bowel. There is no bowel dilation to suggest obstruction. No bowel inflammation. 2. No other evidence of an acute abnormality within the abdomen or pelvis. 3. Multiple renal cysts with renal cortical thinning. 4. Aortic atherosclerosis. Electronically Signed   By: Lajean Manes M.D.   On: 07/09/2018 16:00   Ct Head Wo Contrast  Result Date: 07/09/2018 CLINICAL DATA:  Dizziness. EXAM: CT HEAD WITHOUT CONTRAST TECHNIQUE:  Contiguous axial images were obtained from the base of the skull through the vertex without intravenous contrast. COMPARISON:  None. FINDINGS: Brain: No evidence of acute infarction, hemorrhage, hydrocephalus, extra-axial collection or mass lesion/mass effect. There is age appropriate volume loss. Mild white matter hypoattenuation is noted consistent with chronic microvascular ischemic change. Vascular: No hyperdense vessel or unexpected calcification. Skull: Normal. Negative for fracture or focal lesion. Sinuses/Orbits: Globes and orbits are unremarkable. Visualized sinuses are clear. Other: None. IMPRESSION: 1. No acute intracranial abnormalities. 2. Age-appropriate volume loss. Mild chronic microvascular ischemic change. Electronically Signed   By: Lajean Manes M.D.   On: 07/09/2018 15:52    Scheduled Meds: . amLODipine  5 mg Oral Daily  . atorvastatin  10 mg Oral q1800  . brinzolamide  1 drop Both Eyes TID  . latanoprost  1 drop Both Eyes QHS  . tamsulosin  0.4 mg Oral Daily  . timolol  1 drop Both Eyes Daily    Continuous Infusions: . sodium chloride       LOS: 1 day     Alma Friendly, MD [facility]   If 7PM-7AM, please contact night-coverage [email or web address] Password [facility] 07/10/2018, 10:52 AM

## 2018-07-10 NOTE — Evaluation (Addendum)
Physical Therapy Evaluation Patient Details Name: Matthew Winters MRN: [id number] DOB: 1935/12/13 Today's Date: 07/10/2018   History of Present Illness  82 year old male with medical history significant for CKD, CAD, hypertension, gout, who was brought in by family complaining of vomiting and dizziness; CT abdomen pelvis showed possible mild adynamic ileus  Clinical Impression  Pt admitted with above diagnosis. Pt currently with functional limitations due to the deficits listed below (see PT Problem List).  Pt feels he is at his baseline however he is unsteady today, balance improved some with incr distance, may benefit from cane; will follow and continue to assess for needs;  Pt will benefit from skilled PT to increase their independence and safety with mobility to allow discharge to the venue listed below.       Follow Up Recommendations Home health PT(vs no f/u)    Equipment Recommendations  Other (comment)(TBD )    Recommendations for Other Services       Precautions / Restrictions Precautions Precautions: Fall      Mobility  Bed Mobility Overal bed mobility: Needs Assistance Bed Mobility: Supine to Sit;Sit to Supine     Supine to sit: Supervision Sit to supine: Supervision   General bed mobility comments: incr time  Transfers Overall transfer level: Needs assistance Equipment used: Rolling walker (2 wheeled) Transfers: Sit to/from Stand Sit to Stand: Min guard;Min assist         General transfer comment: light assist to rise and steady  Ambulation/Gait Ambulation/Gait assistance: Min guard;Supervision Gait Distance (Feet): 350 Feet Assistive device: IV Pole;None Gait Pattern/deviations: Step-through pattern;Decreased stride length;Drifts right/left     General Gait Details: slight drifting, unsteady but no overt LOB, reliant on IV pole/UE support  Stairs            Wheelchair Mobility    Modified Rankin (Stroke Patients Only)       Balance Overall  balance assessment: Needs assistance           Standing balance-Leahy Scale: Fair               High level balance activites: Direction changes;Turns High Level Balance Comments: min/guard for balance during turns              Pertinent Vitals/Pain Pain Assessment: No/denies pain    Home Living Family/patient expects to be discharged to:: Private residence Living Arrangements: Children(daughter)   Type of Home: House Home Access: Stairs to enter   Technical brewer of Steps: 2 Home Layout: Able to live on main level with bedroom/bathroom Home Equipment: None      Prior Function Level of Independence: Independent               Hand Dominance        Extremity/Trunk Assessment   Upper Extremity Assessment Upper Extremity Assessment: Overall WFL for tasks assessed    Lower Extremity Assessment Lower Extremity Assessment: Overall WFL for tasks assessed       Communication   Communication: No difficulties  Cognition Arousal/Alertness: Awake/alert Behavior During Therapy: WFL for tasks assessed/performed Overall Cognitive Status: Within Functional Limits for tasks assessed                                        General Comments      Exercises     Assessment/Plan    PT Assessment Patient needs continued PT services  PT Problem List  Decreased activity tolerance;Decreased mobility;Decreased balance       PT Treatment Interventions      PT Goals (Current goals can be found in the Care Plan section)  Acute Rehab PT Goals Patient Stated Goal: go home PT Goal Formulation: With patient Time For Goal Achievement: 07/24/18 Potential to Achieve Goals: Good    Frequency Min 3X/week   Barriers to discharge        Co-evaluation               AM-PAC PT "6 Clicks" Daily Activity  Outcome Measure Difficulty turning over in bed (including adjusting bedclothes, sheets and blankets)?: A Little Difficulty moving from  lying on back to sitting on the side of the bed? : A Little Difficulty sitting down on and standing up from a chair with arms (e.g., wheelchair, bedside commode, etc,.)?: Unable Help needed moving to and from a bed to chair (including a wheelchair)?: A Little Help needed walking in hospital room?: A Little Help needed climbing 3-5 steps with a railing? : A Little 6 Click Score: 16    End of Session Equipment Utilized During Treatment: Gait belt Activity Tolerance: Patient tolerated treatment well Patient left: in bed;with bed alarm set;with call bell/phone within reach   PT Visit Diagnosis: Difficulty in walking, not elsewhere classified (R26.2)    Time: 1450-1505 PT Time Calculation (min) (ACUTE ONLY): 15 min   Charges:   PT Evaluation $PT Eval Low Complexity: 1 Low          Kenyon Ana, PT Pager: [phone] 07/10/2018   [facility] 07/10/2018, 4:20 PM

## 2018-07-10 NOTE — Progress Notes (Signed)
PROGRESS NOTE  Matthew Winters [id number] DOB: 1936/03/25 DOA: 07/09/2018 PCP: [name], MD  HPI/Recap of past 85 hours: 82 year old male with medical history significant for CKD, CAD, hypertension, gout, who was brought in by family complaining of vomiting and dizziness for the past 4 days.  Patient also reported poor appetite, and has not been able to keep anything down for those days.  Patient denied any abdominal pain, fever/chills, diarrhea, chest pain, dysuria.  In the ED, pt was noted to be orthostatic positive, CT head unremarkable, CT abdomen/pelvis showed adynamic ileus.  Patient was also noted to have some AKI and urinary retention, Foley was placed in the ED.  Patient admitted for further management   Today, patient denies any further vomiting, still reports poor appetite "states nothing tastes good", denies any abdominal pain, chest pain, fever/chills.  Assessment/Plan: Active Problems:   AKI (acute kidney injury) ([place])   Dehydration   Adynamic ileus (HCC)   Essential hypertension   CKD (chronic kidney disease), stage III (HCC)   Acute urinary retention   Adynamic ileus On presentation, complained of vomiting, poor appetite Afebrile, no leukocytosis LFTs, lipase noted to be elevated, will trend CT abdomen pelvis showed possible mild adynamic ileus. This is suggested by moderate stomach distension and scattered air-fluid levels within the small bowel. There is no bowel dilation to suggest obstruction. No bowel inflammation Will repeat Abd xray today Continue N.p.o., continue IV fluids  AKI on CKD stage 3 Improving Unknown recent baseline, as last Cr 2.5 in 2014 On admission Cr 3.42-->2.5 Likely due to poor oral intake Vs obstructive uropathy UA negative Continue gentle hydration Avoid nephrotoxics, renally dose meds Daily CMP  Acute urinary retention Noted to be retaining urine in ED S/P foley, will start voiding trial Started on flomax Monitor  closely  Orthostatic positive  + On admission, likely due to poor oral intake/hydration Daily orthostatics Continue IVF  Elevated LFTs Down-trending, will trend Unknown etiology Hepatitis panel pending CT abdomen/pelvis: No abnormality of the liver mentioned Monitor  Anemia of CKD Unknown baseline, no signs of bleeding FOBT negative Will monitor, daily cbc  Elevated troponin/Hx of CAD Chest pain free Flat trend, EKG NSR Monitor  HTN BP soft Hold home norvasc  Gout  Continue home meds     Code Status: Full  Family Communication: None at bedside  Disposition Plan: Back home once work up complete   Consultants:  None   Procedures:  None  Antimicrobials:  None   DVT prophylaxis:  SCDs   Objective: Vitals:   07/10/18 0616 07/10/18 1024 07/10/18 1025 07/10/18 1027  BP: 107/69 121/74 114/81 (!) 89/67  Pulse: 79 69 73 (!) 105  Resp: 20 16 20 18   Temp: 98.1 F (36.7 C) 98.7 F (37.1 C)    TempSrc:  Oral    SpO2: 100% 98% 98% 99%  Weight:      Height:        Intake/Output Summary (Last 24 hours) at 07/10/2018 1147 Last data filed at 07/10/2018 1037 Gross per 24 hour  Intake 4367.46 ml  Output 2211 ml  Net 2156.46 ml   Filed Weights   07/09/18 1223  Weight: 80.8 kg    Exam:   General: NAD, chronically ill appearing  Cardiovascular: S1, S2 present  Respiratory: CTAB   Abdomen: Soft  Musculoskeletal: No pedal edema bilaterally  Skin: Normal  Psychiatry: Normal mood   Data Reviewed: CBC: Recent Labs  Lab 07/09/18 1238 07/09/18 2200 07/10/18 0519  WBC 2.8*  3.2* 4.4  NEUTROABS  --  1.9 2.9  HGB 10.8* 10.6* 9.9*  HCT 33.1* 32.2* 30.1*  MCV 82.5 83.4 82.7  PLT 171 130* 093*   Basic Metabolic Panel: Recent Labs  Lab 07/09/18 1238 07/09/18 2103 07/10/18 0519  NA 138  --  136  K 4.4  --  4.1  CL 105  --  106  CO2 21*  --  21*  GLUCOSE 83  --  70  BUN 54*  --  39*  CREATININE 3.42*  --  2.50*  CALCIUM 9.5  --   8.8*  MG  --  2.2 2.0  PHOS  --   --  3.1   GFR: Estimated Creatinine Clearance: 23.5 mL/min (A) (by C-G formula based on SCr of 2.5 mg/dL (H)). Liver Function Tests: Recent Labs  Lab 07/09/18 1238 07/10/18 0519  AST 126* 106*  ALT 120* 101*  ALKPHOS 99 82  BILITOT 1.4* 1.2  PROT 7.6 6.2*  ALBUMIN 4.1 3.3*   Recent Labs  Lab 07/09/18 1238  LIPASE 59*   No results for input(s): AMMONIA in the last 168 hours. Coagulation Profile: No results for input(s): INR, PROTIME in the last 168 hours. Cardiac Enzymes: Recent Labs  Lab 07/09/18 2103  CKTOTAL 485*  TROPONINI 0.05*   BNP (last 3 results) No results for input(s): PROBNP in the last 8760 hours. HbA1C: No results for input(s): HGBA1C in the last 72 hours. CBG: No results for input(s): GLUCAP in the last 168 hours. Lipid Profile: No results for input(s): CHOL, HDL, LDLCALC, TRIG, CHOLHDL, LDLDIRECT in the last 72 hours. Thyroid Function Tests: Recent Labs    07/10/18 0519  TSH 2.455   Anemia Panel: Recent Labs    07/09/18 2103  VITAMINB12 964*  FOLATE 7.5  FERRITIN 871*  TIBC 184*  IRON 96  RETICCTPCT 1.1   Urine analysis:    Component Value Date/Time   COLORURINE YELLOW 07/09/2018 1138   APPEARANCEUR CLEAR 07/09/2018 1138   LABSPEC 1.011 07/09/2018 1138   PHURINE 5.0 07/09/2018 1138   GLUCOSEU NEGATIVE 07/09/2018 1138   HGBUR MODERATE (A) 07/09/2018 1138   BILIRUBINUR NEGATIVE 07/09/2018 1138   KETONESUR 5 (A) 07/09/2018 1138   PROTEINUR NEGATIVE 07/09/2018 1138   UROBILINOGEN 0.2 02/26/2008 1900   NITRITE NEGATIVE 07/09/2018 1138   LEUKOCYTESUR NEGATIVE 07/09/2018 1138   Sepsis Labs: @LABRCNTIP (procalcitonin:4,lacticidven:4)  )No results found for this or any previous visit (from the past 240 hour(s)).    Studies: Ct Abdomen Pelvis Wo Contrast  Result Date: 07/09/2018 CLINICAL DATA:  Patient [name], c/o N/V x4 days.Denies abdominal pain, chest pain, SOB, and diarrhea EXAM: CT  ABDOMEN AND PELVIS WITHOUT CONTRAST TECHNIQUE: Multidetector CT imaging of the abdomen and pelvis was performed following the standard protocol without IV contrast. COMPARISON:  None. FINDINGS: Lower chest: No acute abnormality. Hepatobiliary: Choose 1 Pancreas: Choose 1 Spleen: Choose 1 Adrenals/Urinary Tract: No adrenal masses. Bilateral low-density renal masses consistent with cysts, largest from the upper pole of the left kidney measuring 8.7 cm. Bilateral renal cortical thinning. Small calcification in the upper pole of the right kidney likely lies on the wall of the upper pole cyst. No convincing collecting system calcifications. No hydronephrosis. Ureters are normal in course and caliber. Bladder is unremarkable. Stomach/Bowel: Stomach is moderately distended. No stomach wall thickening or adjacent inflammation. Small bowel is normal in caliber. There are small bowel air-fluid levels. There is no wall thickening or adjacent inflammation. Colon is normal in caliber. No colonic  wall thickening or inflammation. Normal appendix visualized. Vascular/Lymphatic: Aortic atherosclerosis. No enlarged abdominal or pelvic lymph nodes. Reproductive: Prostate enlarged measuring 5.5 x 4.1 cm transversely. Other: No abdominal wall hernia or abnormality. No abdominopelvic ascites. Musculoskeletal: No fracture or acute finding. No osteoblastic or osteolytic lesions. IMPRESSION: 1. Possible mild adynamic ileus. This is suggested by moderate stomach distension and scattered air-fluid levels within the small bowel. There is no bowel dilation to suggest obstruction. No bowel inflammation. 2. No other evidence of an acute abnormality within the abdomen or pelvis. 3. Multiple renal cysts with renal cortical thinning. 4. Aortic atherosclerosis. Electronically Signed   By: Lajean Manes M.D.   On: 07/09/2018 16:00   Ct Head Wo Contrast  Result Date: 07/09/2018 CLINICAL DATA:  Dizziness. EXAM: CT HEAD WITHOUT CONTRAST TECHNIQUE:  Contiguous axial images were obtained from the base of the skull through the vertex without intravenous contrast. COMPARISON:  None. FINDINGS: Brain: No evidence of acute infarction, hemorrhage, hydrocephalus, extra-axial collection or mass lesion/mass effect. There is age appropriate volume loss. Mild white matter hypoattenuation is noted consistent with chronic microvascular ischemic change. Vascular: No hyperdense vessel or unexpected calcification. Skull: Normal. Negative for fracture or focal lesion. Sinuses/Orbits: Globes and orbits are unremarkable. Visualized sinuses are clear. Other: None. IMPRESSION: 1. No acute intracranial abnormalities. 2. Age-appropriate volume loss. Mild chronic microvascular ischemic change. Electronically Signed   By: Lajean Manes M.D.   On: 07/09/2018 15:52   Dg Abd Portable 2v  Result Date: 07/10/2018 CLINICAL DATA:  Order requisition states vomiting, but pt states he does not have any abdominal complaints. Reports 2-3 days ago his BM was black but unsure about details. EXAM: PORTABLE ABDOMEN - 2 VIEW COMPARISON:  CT, 07/09/2018 FINDINGS: There is no bowel dilation to suggest obstruction. Residual contrast is noted in the colon. There are few colonic air-fluid levels on the decubitus view. No free air. IMPRESSION: 1. No evidence of bowel obstruction or free air. 2. Few colonic air-fluid levels, nonspecific. No findings of a significant adynamic ileus. Electronically Signed   By: Lajean Manes M.D.   On: 07/10/2018 11:44    Scheduled Meds: . atorvastatin  10 mg Oral q1800  . brinzolamide  1 drop Both Eyes TID  . latanoprost  1 drop Both Eyes QHS  . tamsulosin  0.4 mg Oral Daily  . timolol  1 drop Both Eyes Daily    Continuous Infusions: . dextrose 5 % and 0.45% NaCl 75 mL/hr at 07/10/18 1111     LOS: 1 day     Alma Friendly, MD [facility]   If 7PM-7AM, please contact night-coverage [email or web address] Password [facility] 07/10/2018, 11:47 AM

## 2018-07-11 ENCOUNTER — Inpatient Hospital Stay (HOSPITAL_COMMUNITY): Payer: Medicare Other

## 2018-07-11 DIAGNOSIS — R531 Weakness: Secondary | ICD-10-CM

## 2018-07-11 DIAGNOSIS — R112 Nausea with vomiting, unspecified: Secondary | ICD-10-CM

## 2018-07-11 LAB — CBC WITH DIFFERENTIAL/PLATELET
BASOS PCT: 0 %
Basophils Absolute: 0 10*3/uL (ref 0.0–0.1)
EOS ABS: 0 10*3/uL (ref 0.0–0.7)
EOS PCT: 0 %
HCT: 27.4 % — ABNORMAL LOW (ref 39.0–52.0)
Hemoglobin: 9.1 g/dL — ABNORMAL LOW (ref 13.0–17.0)
LYMPHS ABS: 0.9 10*3/uL (ref 0.7–4.0)
Lymphocytes Relative: 40 %
MCH: 27.3 pg (ref 26.0–34.0)
MCHC: 33.2 g/dL (ref 30.0–36.0)
MCV: 82.3 fL (ref 78.0–100.0)
MONOS PCT: 10 %
Monocytes Absolute: 0.2 10*3/uL (ref 0.1–1.0)
Neutro Abs: 1.1 10*3/uL — ABNORMAL LOW (ref 1.7–7.7)
Neutrophils Relative %: 50 %
PLATELETS: 105 10*3/uL — AB (ref 150–400)
RBC: 3.33 MIL/uL — ABNORMAL LOW (ref 4.22–5.81)
RDW: 15.8 % — AB (ref 11.5–15.5)
WBC: 2.3 10*3/uL — ABNORMAL LOW (ref 4.0–10.5)

## 2018-07-11 LAB — LIPID PANEL
CHOL/HDL RATIO: 3.5 ratio
CHOLESTEROL: 88 mg/dL (ref 0–200)
HDL: 25 mg/dL — AB (ref >40)
LDL Cholesterol: 51 mg/dL (ref 0–99)
TRIGLYCERIDES: 60 mg/dL (ref <150)
VLDL: 12 mg/dL (ref 0–40)

## 2018-07-11 LAB — COMPREHENSIVE METABOLIC PANEL
ALT: 90 U/L — ABNORMAL HIGH (ref 0–44)
ANION GAP: 3 — AB (ref 5–15)
AST: 88 U/L — ABNORMAL HIGH (ref 15–41)
Albumin: 2.9 g/dL — ABNORMAL LOW (ref 3.5–5.0)
Alkaline Phosphatase: 72 U/L (ref 38–126)
BUN: 26 mg/dL — ABNORMAL HIGH (ref 8–23)
CHLORIDE: 109 mmol/L (ref 98–111)
CO2: 25 mmol/L (ref 22–32)
Calcium: 8.7 mg/dL — ABNORMAL LOW (ref 8.9–10.3)
Creatinine, Ser: 2.12 mg/dL — ABNORMAL HIGH (ref 0.61–1.24)
GFR calc non Af Amer: 28 mL/min — ABNORMAL LOW (ref >60)
GFR, EST AFRICAN AMERICAN: 32 mL/min — AB (ref >60)
Glucose, Bld: 112 mg/dL — ABNORMAL HIGH (ref 70–99)
POTASSIUM: 3.9 mmol/L (ref 3.5–5.1)
SODIUM: 137 mmol/L (ref 135–145)
Total Bilirubin: 0.9 mg/dL (ref 0.3–1.2)
Total Protein: 5.5 g/dL — ABNORMAL LOW (ref 6.5–8.1)

## 2018-07-11 LAB — LIPASE, BLOOD: LIPASE: 40 U/L (ref 11–51)

## 2018-07-11 LAB — MAGNESIUM: Magnesium: 1.8 mg/dL (ref 1.7–2.4)

## 2018-07-11 MED ORDER — MAGIC MOUTHWASH
5.0000 mL | Freq: Four times a day (QID) | ORAL | Status: DC
  Filled 2018-07-11 (×4): qty 5

## 2018-07-11 MED ORDER — MIRTAZAPINE 15 MG PO TABS
7.5000 mg | ORAL_TABLET | Freq: Every day | ORAL | Status: DC
  Administered 2018-07-11 – 2018-07-14 (×4): 7.5 mg via ORAL
  Filled 2018-07-11 (×4): qty 1

## 2018-07-11 NOTE — Consult Note (Addendum)
Consultation  Referring Provider: [facility] Primary Care Physician:  [name], MD Primary Gastroenterologist:  Dr. Fuller Plan  Reason for Consultation:   Nausea and Vomiting with dehydration  HPI: Matthew Winters is a 82 y.o. male with history of coronary artery disease, chronic kidney disease, hyperlipidemia and hypertension who was admitted on 07/09/2018 with 4-day history of complaints of weakness, nausea and inability to take any p.o.'s.  Patient denies any vomiting but he says he just has absolutely no appetite and that food tastes bad.  He denies any abdominal pain, no heartburn or indigestion.  No diarrhea.  He did note some very dark stools for a couple of days around the same time that he developed to the weakness.  No bowel movement since admission. He denies any aspirin or NSAID use, no new medications, recent antibiotics, no EtOH.   Has not had any prior abdominal surgery. He did have colonoscopy with Dr. Fuller Plan in August 2016 with a total of 10 polyps removed.  The largest was 8 mm.  Path on the polyps all consistent with tubular adenomas.  Also noted mild diverticulosis. Had a very remote EGD in 2004 with finding of gastric AVMs. Work-up on admission with CT of the abdomen and pelvis with no IV or oral contrast showed bilateral renal cysts, stomach noted to be moderately distended, small bowel normal caliber but there were air-fluid levels, there is no bowel wall thickening or obstruction. Follow-up KUB on 07/10/2018 showed no evidence of ileus or obstruction. Labs on admission WBC of 2.8 hemoglobin 10.8 hematocrit of 33.1 platelets 171, creatinine 3.42, CK 485.  T bili 1.4, AST 126, ALT 120 and alk phos of 99.  Lipase mildly elevated at 59.  Iron studies within normal limits and he was Hemoccult negative. Follow-up labs today CBC 2.3, hemoglobin 9.1 hematocrit of 27.4 platelets 105, AST 88, ALT of 90 and creatinine improved at 2.1. Acute hepatitis panel was ordered and  pending   Past Medical History:  Diagnosis Date  . Arthritis   . Cataract   . Glaucoma   . Gout   . History of blood transfusion   . Hyperlipidemia   . Hypertension     Past Surgical History:  Procedure Laterality Date  . CATARACT EXTRACTION W/PHACO  07/27/2012   Procedure: CATARACT EXTRACTION PHACO AND INTRAOCULAR LENS PLACEMENT (IOC);  Surgeon: Marylynn Pearson, MD;  Location: [facility];  Service: Ophthalmology;  Laterality: Right;  . COLONOSCOPY    . COLONOSCOPY W/ POLYPECTOMY    . EYE SURGERY  2012   glaucoma surgery - right  . MULTIPLE EXTRACTIONS WITH ALVEOLOPLASTY N/A 10/16/2013   Procedure: MULTIPLE EXTRACION WITH ALVEOLOPLASTY;  Surgeon: Gae Bon, DDS;  Location: [place];  Service: Oral Surgery;  Laterality: N/A;  . POLYPECTOMY      Prior to Admission medications   Medication Sig Start Date End Date Taking? Authorizing Provider  allopurinol (ZYLOPRIM) 100 MG tablet Take 100 mg by mouth daily as needed (gout flares).    Yes [provider]  amLODipine (NORVASC) 5 MG tablet Take 5 mg by mouth daily. 06/27/18  Yes [provider]  atorvastatin (LIPITOR) 10 MG tablet Take 10 mg by mouth daily.   Yes [provider]  AZOPT 1 % ophthalmic suspension Place 1 drop into both eyes 3 (three) times daily. 07/01/18  Yes [provider]  bimatoprost (LUMIGAN) 0.03 % ophthalmic solution Place 1 drop into both eyes at bedtime.   Yes [provider]  colchicine 0.6 MG tablet Take 0.6 mg by mouth daily as needed (gout flares).    Yes [provider]  timolol (TIMOPTIC) 0.5 % ophthalmic solution Place 1 drop into both eyes daily. 07/01/18  Yes [provider]    Current Facility-Administered Medications  Medication Dose Route Frequency Provider Last Rate Last Dose  . acetaminophen (TYLENOL) tablet 650 mg  650 mg Oral Q6H PRN Toy Baker, MD       Or  . acetaminophen (TYLENOL) suppository 650 mg  650 mg  Rectal Q6H PRN Doutova, Anastassia, MD      . allopurinol (ZYLOPRIM) tablet 100 mg  100 mg Oral Daily PRN Doutova, Anastassia, MD      . atorvastatin (LIPITOR) tablet 10 mg  10 mg Oral q1800 Doutova, Anastassia, MD   10 mg at 07/10/18 1826  . brinzolamide (AZOPT) 1 % ophthalmic suspension 1 drop  1 drop Both Eyes TID Toy Baker, MD   1 drop at 07/11/18 0935  . dextrose 5 %-0.45 % sodium chloride infusion   Intravenous Continuous Alma Friendly, MD 100 mL/hr at 07/11/18 0936    . HYDROcodone-acetaminophen (NORCO/VICODIN) 5-325 MG per tablet 1-2 tablet  1-2 tablet Oral Q4H PRN Doutova, Anastassia, MD      . latanoprost (XALATAN) 0.005 % ophthalmic solution 1 drop  1 drop Both Eyes QHS Doutova, Anastassia, MD   1 drop at 07/10/18 2155  . mirtazapine (REMERON) tablet 7.5 mg  7.5 mg Oral QHS Alma Friendly, MD      . ondansetron [facility]) tablet 4 mg  4 mg Oral Q6H PRN Toy Baker, MD       Or  . ondansetron (ZOFRAN) injection 4 mg  4 mg Intravenous Q6H PRN Doutova, Anastassia, MD      . timolol (TIMOPTIC) 0.5 % ophthalmic solution 1 drop  1 drop Both Eyes Daily Doutova, Anastassia, MD   1 drop at 07/11/18 0938    Allergies as of 07/09/2018  . (No Known Allergies)    Family History  Problem Relation Age of Onset  . Heart disease Mother   . Colon cancer Neg Hx   . Colon polyps Neg Hx   . Rectal cancer Neg Hx   . Stomach cancer Neg Hx     Social History   Socioeconomic History  . Marital status: Widowed    Spouse name: Not on file  . Number of children: Not on file  . Years of education: Not on file  . Highest education level: Not on file  Occupational History  . Not on file  Social Needs  . Financial resource strain: Not on file  . Food insecurity:    Worry: Not on file    Inability: Not on file  . Transportation needs:    Medical: Not on file    Non-medical: Not on file  Tobacco Use  . Smoking status: Former Research scientist (life sciences)  . Smokeless tobacco: Never Used   Substance and Sexual Activity  . Alcohol use: No    Alcohol/week: 0.0 standard drinks  . Drug use: No  . Sexual activity: Not on file  Lifestyle  . Physical activity:    Days per week: Not on file    Minutes per session: Not on file  . Stress: Not on file  Relationships  . Social connections:    Talks on phone: Not on file    Gets together: Not on file    Attends religious service: Not on file  Active member of club or organization: Not on file    Attends meetings of clubs or organizations: Not on file    Relationship status: Not on file  . Intimate partner violence:    Fear of current or ex partner: Not on file    Emotionally abused: Not on file    Physically abused: Not on file    Forced sexual activity: Not on file  Other Topics Concern  . Not on file  Social History Narrative  . Not on file    Review of Systems: Pertinent positive and negative review of systems were noted in the above HPI section.  All other review of systems was otherwise negative.  Physical Exam: Vital signs in last 24 hours: Temp:  [98.5 F (36.9 C)-98.9 F (37.2 C)] 98.7 F (37.1 C) (08/12 0534) Pulse Rate:  [65-111] 69 (08/12 0930) Resp:  [16-20] 18 (08/12 0534) BP: (63-131)/(54-108) 93/59 (08/12 0930) SpO2:  [93 %-100 %] 93 % (08/12 0538) Last BM Date: 07/10/18 General:   Alert,  Well-developed, well-nourished, elderly African-American male, pleasant and cooperative in NAD Head:  Normocephalic and atraumatic. Eyes:  Sclera clear, no icterus.   Conjunctiva pink. Ears:  Normal auditory acuity. Nose:  No deformity, discharge,  or lesions. Mouth: Dry yellow coating on the tongue Neck:  Supple; no masses or thyromegaly. Lungs:  Clear throughout to auscultation.   No wheezes, crackles, or rhonchi. Heart:  Regular rate and rhythm; no murmurs, clicks, rubs,  or gallops. Abdomen:  Soft,nontender, BS active,nonpalp mass or hsm.   Rectal:  Deferred  Msk:  Symmetrical without gross deformities.  . Pulses:  Normal pulses noted. Extremities:  Without clubbing or edema. Neurologic:  Alert and  oriented x4;  grossly normal neurologically. Skin:  Intact without significant lesions or rashes.. Psych:  Alert and cooperative. Normal mood and affect.  Intake/Output from previous day: 08/11 0701 - 08/12 0700 In: 1298.5 [P.O.:480; I.V.:818.5] Out: 2101 [Urine:2100; Stool:1] Intake/Output this shift: No intake/output data recorded.  Lab Results: Recent Labs    07/09/18 2200 07/10/18 0519 07/11/18 0532  WBC 3.2* 4.4 2.3*  HGB 10.6* 9.9* 9.1*  HCT 32.2* 30.1* 27.4*  PLT 130* 125* 105*   BMET Recent Labs    07/09/18 1238 07/10/18 0519 07/11/18 0532  NA 138 136 137  K 4.4 4.1 3.9  CL 105 106 109  CO2 21* 21* 25  GLUCOSE 83 70 112*  BUN 54* 39* 26*  CREATININE 3.42* 2.50* 2.12*  CALCIUM 9.5 8.8* 8.7*   LFT Recent Labs    07/11/18 0532  PROT 5.5*  ALBUMIN 2.9*  AST 88*  ALT 90*  ALKPHOS 72  BILITOT 0.9   PT/INR No results for input(s): LABPROT, INR in the last 72 hours. Hepatitis Panel No results for input(s): HEPBSAG, HCVAB, HEPAIGM, HEPBIGM in the last 72 hours.    IMPRESSION:  51) 82 year old African-American male admitted with 4-day history of anorexia nausea and weakness.  On admit found to have urinary retention, he was orthostatic and with evidence of acute kidney injury and elevated CK.  Etiology of acute illness is not clear at this time, noncontrasted CT scan on admission showed probable adynamic ileus.  KUB the following day unremarkable.  He does have a transaminitis.  Rule out gallbladder disease, hepatitis, underlying hepatobiliary malignancy  Question mild rhabdomyolysis as etiology for symptoms, patient was on a statin at home  #2 acute kidney injury-resolving #3 mild pancytopenia #4 history of hypertension #5 history of coronary artery disease #  6 history of gout #7 history of multiple adenomatous colon polyps--last colonoscopy August  2016 #8 probable oral thrush  PLAN: Continue hydration Full liquids as tolerated Have scheduled for upper abdominal ultrasound hepatitis panel pending Start Magic mouthwash 5 cc swish and spit 4 times daily Thank you, will follow with you.  Depending on results of ultrasound may need repeat CT with IV and oral contrast, and/or EGD.   Amy Esterwood  07/11/2018, 11:48 AM      Attending physician's note   I have taken a history, examined the patient and reviewed the chart. I agree with the Advanced Practitioner's note, impression and recommendations. Nausea, vomiting, anorexia, urinary retention, resolving ileus, AKI, dehydration, elevated CK, mildly elevated transaminases, probable oral thrush. Schedule abd [place]. Magic mouthwash. IV fluids. Consider a contrasted CT when Cr improves and/or EGD if diagnosis not established.    Lucio Edward, MD FACG [phone] office

## 2018-07-11 NOTE — Progress Notes (Signed)
PROGRESS NOTE  Matthew Winters OIZ:[id number] DOB: [birth year] DOA: 07/09/2018 PCP: [name], MD  HPI/Recap of past 65 hours: 82 year old male with medical history significant for CKD, CAD, hypertension, gout, who was brought in by family complaining of vomiting and dizziness for the past 4 days.  Patient also reported poor appetite, and has not been able to keep anything down for those days.  Patient denied any abdominal pain, fever/chills, diarrhea, chest pain, dysuria.  In the ED, pt was noted to be orthostatic positive, CT head unremarkable, CT abdomen/pelvis showed adynamic ileus.  Patient was also noted to have some AKI and urinary retention, Foley was placed in the ED.  Patient admitted for further management   Today, patient still reports poor appetite "states nothing tastes good", reports ?early satiety, denies any further N/V, abdominal pain, chest pain, fever/chills.  Assessment/Plan: Active Problems:   AKI (acute kidney injury) ([place])   Dehydration   Adynamic ileus ([facility])   Essential hypertension   CKD (chronic kidney disease), stage III ([place])   Acute urinary retention   Unlikely adynamic ileus On presentation, complained of vomiting, poor appetite Afebrile, no leukocytosis LFTs, lipase noted to be elevated, will trend CT abdomen pelvis showed possible mild adynamic ileus. This is suggested by moderate stomach distension and scattered air-fluid levels within the small bowel. There is no bowel dilation to suggest obstruction. No bowel inflammation Repeat Abd xray unremarkable GI consulted: rec Abdominal USS for now Continue IV fluids, advance diet as tolerated  Elevated LFTs Down-trending, will trend Unknown etiology, ??hepatitis, ??underlying gallbladder dsz ??malignancy Hepatitis panel pending CT abdomen/pelvis: No abnormality of the liver mentioned GI consulted: Abd USS for now Daily CMP  AKI on CKD stage 3 Improving Unknown recent baseline, as last Cr 2.5 in  2014 On admission Cr 3.42-->2.5-->2.12 Likely due to poor oral intake Vs obstructive uropathy UA negative Continue gentle hydration Avoid nephrotoxics, renally dose meds Daily CMP  Pancytopenia ANC 1.1 Unknown etiology, ??malignanacy Daily CBC, monitor closely  Acute urinary retention Noted to be retaining urine in ED S/P foley, plan to start voiding trial Was started on flomax during this admission, will d/c due to significant orthostatsis Monitor closely  Orthostatic positive  Still Ongoing + On admission, likely due to poor oral intake/hydration Daily orthostatics Continue IVF  Anemia of CKD Unknown baseline, no signs of bleeding FOBT negative Daily cbc  Elevated troponin/Hx of CAD Chest pain free Flat trend, EKG NSR Monitor  HTN BP soft Hold home norvasc  Gout  Continue home meds     Code Status: Full  Family Communication: None at bedside  Disposition Plan: Once work up complete   Consultants:  GI   Procedures:  None  Antimicrobials:  None   DVT prophylaxis:  SCDs   Objective: Vitals:   07/11/18 0535 07/11/18 0538 07/11/18 0930 07/11/18 1401  BP: 94/64 (!) 63/54 (!) 93/59 111/67  Pulse: 87 (!) 111 69 64  Resp:    16  Temp:    98.3 F (36.8 C)  TempSrc:    Oral  SpO2: 96% 93%  100%  Weight:      Height:        Intake/Output Summary (Last 24 hours) at 07/11/2018 1407 Last data filed at 07/11/2018 0540 Gross per 24 hour  Intake 120 ml  Output 850 ml  Net -730 ml   Filed Weights   07/09/18 1223  Weight: 80.8 kg    Exam:   General: NAD, chronically ill appearing  Cardiovascular: S1,  S2 present  Respiratory: CTAB   Abdomen: Soft, NT, ND, BS present  Musculoskeletal: No pedal edema bilaterally  Skin: Normal  Psychiatry: Normal mood   Data Reviewed: CBC: Recent Labs  Lab 07/09/18 1238 07/09/18 2200 07/10/18 0519 07/11/18 0532  WBC 2.8* 3.2* 4.4 2.3*  NEUTROABS  --  1.9 2.9 1.1*  HGB 10.8* 10.6* 9.9*  9.1*  HCT 33.1* 32.2* 30.1* 27.4*  MCV 82.5 83.4 82.7 82.3  PLT 171 130* 125* 409*   Basic Metabolic Panel: Recent Labs  Lab 07/09/18 1238 07/09/18 2103 07/10/18 0519 07/11/18 0532  NA 138  --  136 137  K 4.4  --  4.1 3.9  CL 105  --  106 109  CO2 21*  --  21* 25  GLUCOSE 83  --  70 112*  BUN 54*  --  39* 26*  CREATININE 3.42*  --  2.50* 2.12*  CALCIUM 9.5  --  8.8* 8.7*  MG  --  2.2 2.0 1.8  PHOS  --   --  3.1  --    GFR: Estimated Creatinine Clearance: 27.8 mL/min (A) (by C-G formula based on SCr of 2.12 mg/dL (H)). Liver Function Tests: Recent Labs  Lab 07/09/18 1238 07/10/18 0519 07/11/18 0532  AST 126* 106* 88*  ALT 120* 101* 90*  ALKPHOS 99 82 72  BILITOT 1.4* 1.2 0.9  PROT 7.6 6.2* 5.5*  ALBUMIN 4.1 3.3* 2.9*   Recent Labs  Lab 07/09/18 1238 07/11/18 0532  LIPASE 59* 40   No results for input(s): AMMONIA in the last 168 hours. Coagulation Profile: No results for input(s): INR, PROTIME in the last 168 hours. Cardiac Enzymes: Recent Labs  Lab 07/09/18 2103 07/10/18 1152  CKTOTAL 485*  --   TROPONINI 0.05* 0.04*   BNP (last 3 results) No results for input(s): PROBNP in the last 8760 hours. HbA1C: No results for input(s): HGBA1C in the last 72 hours. CBG: No results for input(s): GLUCAP in the last 168 hours. Lipid Profile: Recent Labs    07/11/18 0532  CHOL 88  HDL 25*  LDLCALC 51  TRIG 60  CHOLHDL 3.5   Thyroid Function Tests: Recent Labs    07/10/18 0519  TSH 2.455   Anemia Panel: Recent Labs    07/09/18 2103  VITAMINB12 964*  FOLATE 7.5  FERRITIN 871*  TIBC 184*  IRON 96  RETICCTPCT 1.1   Urine analysis:    Component Value Date/Time   COLORURINE YELLOW 07/09/2018 [place] 07/09/2018 1138   LABSPEC 1.011 07/09/2018 1138   PHURINE 5.0 07/09/2018 1138   GLUCOSEU NEGATIVE 07/09/2018 1138   HGBUR MODERATE (A) 07/09/2018 1138   BILIRUBINUR NEGATIVE 07/09/2018 1138   KETONESUR 5 (A) 07/09/2018 1138    PROTEINUR NEGATIVE 07/09/2018 1138   UROBILINOGEN 0.2 02/26/2008 1900   NITRITE NEGATIVE 07/09/2018 1138   LEUKOCYTESUR NEGATIVE 07/09/2018 1138   Sepsis Labs: @LABRCNTIP (procalcitonin:4,lacticidven:4)  )No results found for this or any previous visit (from the past 240 hour(s)).    Studies: No results found.  Scheduled Meds: . brinzolamide  1 drop Both Eyes TID  . latanoprost  1 drop Both Eyes QHS  . magic mouthwash  5 mL Oral QID  . mirtazapine  7.5 mg Oral QHS  . timolol  1 drop Both Eyes Daily    Continuous Infusions: . dextrose 5 % and 0.45% NaCl 100 mL/hr at 07/11/18 0936     LOS: 2 days     Alma Friendly, MD  [facility]   If 7PM-7AM, please contact night-coverage [email or web address] Password [facility] 07/11/2018, 2:07 PM

## 2018-07-11 NOTE — Care Management Note (Signed)
Case Management Note  Patient Details  Name: Matthew Winters MRN: [id number] Date of Birth: 28-Mar-1936  Subjective/Objective:     Admitted due to nausea and vomiting x4 days, dehydration/lives at home with daughter/ aki-bun 26 creat 2.12/wcb 2.3 Plat[place] 105/ hx of ckd and cad/ctscan -ileus/              Action/Plan: Following for cm needs  Expected Discharge Date:                  Expected Discharge Plan:  Home/Self Care  In-House Referral:     Discharge planning Services  CM Consult  Post Acute Care Choice:    Choice offered to:     DME Arranged:    DME Agency:     HH Arranged:    HH Agency:     Status of Service:  In process, will continue to follow  If discussed at Long Length of Stay Meetings, dates discussed:    Additional Comments:  Matthew, [place], RN 07/11/2018, 11:23 AM

## 2018-07-12 ENCOUNTER — Inpatient Hospital Stay (HOSPITAL_COMMUNITY): Payer: Medicare Other

## 2018-07-12 DIAGNOSIS — R748 Abnormal levels of other serum enzymes: Secondary | ICD-10-CM

## 2018-07-12 DIAGNOSIS — R630 Anorexia: Secondary | ICD-10-CM

## 2018-07-12 LAB — CBC WITH DIFFERENTIAL/PLATELET
BASOS ABS: 0 10*3/uL (ref 0.0–0.1)
Basophils Relative: 0 %
EOS ABS: 0 10*3/uL (ref 0.0–0.7)
Eosinophils Relative: 1 %
HCT: 30 % — ABNORMAL LOW (ref 39.0–52.0)
Hemoglobin: 9.9 g/dL — ABNORMAL LOW (ref 13.0–17.0)
LYMPHS ABS: 1 10*3/uL (ref 0.7–4.0)
LYMPHS PCT: 43 %
MCH: 27 pg (ref 26.0–34.0)
MCHC: 33 g/dL (ref 30.0–36.0)
MCV: 82 fL (ref 78.0–100.0)
Monocytes Absolute: 0.2 10*3/uL (ref 0.1–1.0)
Monocytes Relative: 10 %
Neutro Abs: 1 10*3/uL — ABNORMAL LOW (ref 1.7–7.7)
Neutrophils Relative %: 46 %
PLATELETS: 107 10*3/uL — AB (ref 150–400)
RBC: 3.66 MIL/uL — AB (ref 4.22–5.81)
RDW: 15.7 % — ABNORMAL HIGH (ref 11.5–15.5)
WBC: 2.3 10*3/uL — AB (ref 4.0–10.5)

## 2018-07-12 LAB — HEMOGLOBIN A1C
HEMOGLOBIN A1C: 5.4 % (ref 4.8–5.6)
Mean Plasma Glucose: 108 mg/dL

## 2018-07-12 LAB — COMPREHENSIVE METABOLIC PANEL
ALK PHOS: 80 U/L (ref 38–126)
ALT: 96 U/L — ABNORMAL HIGH (ref 0–44)
AST: 92 U/L — AB (ref 15–41)
Albumin: 2.9 g/dL — ABNORMAL LOW (ref 3.5–5.0)
Anion gap: 8 (ref 5–15)
BUN: 16 mg/dL (ref 8–23)
CHLORIDE: 108 mmol/L (ref 98–111)
CO2: 25 mmol/L (ref 22–32)
CREATININE: 1.88 mg/dL — AB (ref 0.61–1.24)
Calcium: 8.7 mg/dL — ABNORMAL LOW (ref 8.9–10.3)
GFR calc non Af Amer: 32 mL/min — ABNORMAL LOW (ref >60)
GFR, EST AFRICAN AMERICAN: 37 mL/min — AB (ref >60)
Glucose, Bld: 107 mg/dL — ABNORMAL HIGH (ref 70–99)
Potassium: 3.4 mmol/L — ABNORMAL LOW (ref 3.5–5.1)
SODIUM: 141 mmol/L (ref 135–145)
Total Bilirubin: 0.9 mg/dL (ref 0.3–1.2)
Total Protein: 5.8 g/dL — ABNORMAL LOW (ref 6.5–8.1)

## 2018-07-12 LAB — HEPATITIS PANEL, ACUTE
HEP B S AG: NEGATIVE
Hep A IgM: NEGATIVE
Hep B C IgM: NEGATIVE

## 2018-07-12 LAB — SAVE SMEAR

## 2018-07-12 LAB — CK: Total CK: 366 U/L (ref 49–397)

## 2018-07-12 MED ORDER — ORAL CARE MOUTH RINSE
15.0000 mL | Freq: Two times a day (BID) | OROMUCOSAL | Status: DC
  Administered 2018-07-12 – 2018-07-15 (×6): 15 mL via OROMUCOSAL

## 2018-07-12 MED ORDER — CHLORHEXIDINE GLUCONATE 0.12 % MT SOLN
15.0000 mL | Freq: Two times a day (BID) | OROMUCOSAL | Status: DC
  Administered 2018-07-12 – 2018-07-16 (×9): 15 mL via OROMUCOSAL
  Filled 2018-07-12 (×9): qty 15

## 2018-07-12 MED ORDER — NYSTATIN 100000 UNIT/ML MT SUSP
5.0000 mL | Freq: Four times a day (QID) | OROMUCOSAL | Status: DC
  Administered 2018-07-12 – 2018-07-16 (×17): 500000 [IU] via ORAL
  Filled 2018-07-12 (×17): qty 5

## 2018-07-12 MED ORDER — ENSURE ENLIVE PO LIQD
237.0000 mL | Freq: Two times a day (BID) | ORAL | Status: DC
  Administered 2018-07-12 – 2018-07-16 (×7): 237 mL via ORAL

## 2018-07-12 NOTE — Progress Notes (Signed)
Physical Therapy Treatment Patient Details Name: Matthew Winters MRN: [id number] DOB: 1936/09/10 Today's Date: 07/12/2018    History of Present Illness 82 year old male with medical history significant for CKD, CAD, hypertension, gout, who was brought in by family complaining of vomiting and dizziness; CT abdomen pelvis showed possible mild adynamic ileus    PT Comments    Pt pleasant but quiet.  Max c/o feeling "tired" and poor po intake.  Assisted OOB to amb pt required the use of a walker.  Prior used nothing.  Mod c/o dizziness with each position change.   Orthostatics: Supine                        108/76(87), HR 65, RA 98% EOB                           103/72(76), HR 64, RA 99% Standing                        67/56(62), HR 79, RA 100% amb 5 feet                     93/67(71), HR 98. RA 100%  Follow Up Recommendations  Home health PT     Equipment Recommendations  Rolling walker with 5" wheels    Recommendations for Other Services       Precautions / Restrictions Precautions Precautions: Fall Restrictions Weight Bearing Restrictions: No    Mobility  Bed Mobility Overal bed mobility: Needs Assistance Bed Mobility: Supine to Sit;Sit to Supine     Supine to sit: Supervision Sit to supine: Supervision   General bed mobility comments: able just requires increased time  Transfers Overall transfer level: Needs assistance Equipment used: Rolling walker (2 wheeled);None Transfers: Sit to/from [employer] to Stand: Supervision;Min guard Stand pivot transfers: Supervision;Min guard       General transfer comment: mild c/o dizziness  Ambulation/Gait Ambulation/Gait assistance: Min guard Gait Distance (Feet): 110 Feet Assistive device: Rolling walker (2 wheeled) Gait Pattern/deviations: Step-through pattern Gait velocity: decreased    General Gait Details: MAX c/o fatigue and mild gait unsteadiness.  Required need for a walker.  prior amb with  nothing.  Feels weak(not eating much)  Decreased distance from last session.   Stairs             Wheelchair Mobility    Modified Rankin (Stroke Patients Only)       Balance                                            Cognition   Behavior During Therapy: Flat affect                                   General Comments: pleasant, cooperative, quite       Exercises      General Comments        Pertinent Vitals/Pain Pain Assessment: No/denies pain    Home Living                      Prior Function            PT Goals (current goals can now be  found in the care plan section) Progress towards PT goals: Progressing toward goals    Frequency    Min 3X/week      PT Plan Current plan remains appropriate    Co-evaluation              AM-PAC PT "6 Clicks" Daily Activity  Outcome Measure  Difficulty turning over in bed (including adjusting bedclothes, sheets and blankets)?: A Little Difficulty moving from lying on back to sitting on the side of the bed? : A Little Difficulty sitting down on and standing up from a chair with arms (e.g., wheelchair, bedside commode, etc,.)?: Unable Help needed moving to and from a bed to chair (including a wheelchair)?: A Little Help needed walking in hospital room?: A Little Help needed climbing 3-5 steps with a railing? : A Little 6 Click Score: 16    End of Session Equipment Utilized During Treatment: Gait belt Activity Tolerance: Patient tolerated treatment well Patient left: in bed;with bed alarm set;with call bell/phone within reach   PT Visit Diagnosis: Difficulty in walking, not elsewhere classified (R26.2)     Time: 3491-7915 PT Time Calculation (min) (ACUTE ONLY): 25 min  Charges:  $Gait Training: 8-22 mins $Therapeutic Activity: 8-22 mins                     Rica Koyanagi  PTA [facility]  Acute  Rehab Pager      [phone]

## 2018-07-12 NOTE — Care Management Important Message (Signed)
Important Message  Patient Details  Name: Matthew Winters MRN: [id number] Date of Birth: Aug 09, 1936   Medicare Important Message Given:  Yes    Kerin Salen 07/12/2018, 12:15 [place] Message  Patient Details  Name: Matthew Winters MRN: [id number] Date of Birth: [birth year]   Medicare Important Message Given:  Yes    Kerin Salen 07/12/2018, 12:14 PM

## 2018-07-12 NOTE — Progress Notes (Signed)
PROGRESS NOTE  Matthew Winters DGL:[id number] DOB: 02-27-1936 DOA: 07/09/2018 PCP: [name], MD  HPI/Recap of past 29 hours: 82 year old male with medical history significant for CKD, CAD, hypertension, gout, who was brought in by family complaining of vomiting and dizziness for the past 4 days.  Patient also reported poor appetite, and has not been able to keep anything down for those days.  Patient denied any abdominal pain, fever/chills, diarrhea, chest pain, dysuria.  In the ED, pt was noted to be orthostatic positive, CT head unremarkable, CT abdomen/pelvis showed adynamic ileus.  Patient was also noted to have some AKI and urinary retention, Foley was placed in the ED.  Patient admitted for further management   Today, patient still with poor appetite. Denies any feelings of depression. Denies any new complaints  Assessment/Plan: Active Problems:   AKI (acute kidney injury) ([place])   Dehydration   Adynamic ileus (HCC)   Essential hypertension   CKD (chronic kidney disease), stage III (HCC)   Acute urinary retention   Unlikely adynamic ileus/Poor appetite/Oral thrush On presentation, complained of vomiting, poor appetite Afebrile, no leukocytosis LFTs, lipase noted to be elevated, will trend CT abdomen pelvis showed possible mild adynamic ileus. This is suggested by moderate stomach distension and scattered air-fluid levels within the small bowel. There is no bowel dilation to suggest obstruction. No bowel inflammation Repeat Abd xray unremarkable USS of Abdomen: Unremarkable GI consulted: Appreciate recs, plan for CT abd/pelvis with contrast once renal fxn improves Dietician consulted Continue Nystatin, Remeron Continue IV fluids, regular diet  Elevated LFTs Denies any alcohol use Unknown etiology, ??hepatitis, ??underlying gallbladder dsz ??malignancy ??statins Hepatitis panel negative CT abdomen/pelvis: No abnormality of the liver mentioned GI on board Daily CMP  AKI  on CKD stage 3 Improving Unknown recent baseline, as last Cr 2.5 in 2014 On admission Cr 3.42-->2.5-->2.12-->1.88 Likely due to poor oral intake Vs obstructive uropathy UA negative Continue gentle hydration Avoid nephrotoxics, renally dose meds Daily CMP  Pancytopenia ANC 1.0 Unknown etiology, ??malignanacy Requested peripheral blood smear review May need pan CT abd/pelvis/chest with IV once able Daily CBC, monitor closely  Acute urinary retention Noted to be retaining urine in ED S/P foley, start voiding trial Was started on flomax during this admission, will d/c due to significant orthostatsis Monitor closely  Orthostatic positive  Still Ongoing Likely due to poor oral intake/hydration Daily orthostatics Continue IVF  Anemia of CKD Unknown baseline, no signs of bleeding FOBT negative Daily cbc  Elevated troponin/Hx of CAD Chest pain free Flat trend, EKG NSR Monitor  HTN BP soft Hold home norvasc  Gout  Continue home meds     Code Status: Full  Family Communication: None at bedside  Disposition Plan: Once work up complete   Consultants:  GI   Procedures:  None  Antimicrobials:  None   DVT prophylaxis:  SCDs   Objective: Vitals:   07/11/18 2106 07/12/18 0437 07/12/18 0828 07/12/18 1456  BP: 111/71 104/63 100/68 127/79  Pulse: 69 66 84 65  Resp: 20 16 10 14   Temp: 98.9 F (37.2 C) 98.2 F (36.8 C)  98.2 F (36.8 C)  TempSrc: Oral Oral    SpO2: 96% 98% 98% 100%  Weight:      Height:        Intake/Output Summary (Last 24 hours) at 07/12/2018 1515 Last data filed at 07/12/2018 0940 Gross per 24 hour  Intake 120 ml  Output 3200 ml  Net -3080 ml   [employer]  07/09/18 1223  Weight: 80.8 kg    Exam:   General: NAD, chronically ill appearing  Cardiovascular: S1, S2 present  Respiratory: CTAB   Abdomen: Soft, NT, ND, BS present  Musculoskeletal: No pedal edema bilaterally  Skin: Normal  Psychiatry: Normal  mood, denies any feeling of depression   Data Reviewed: CBC: Recent Labs  Lab 07/09/18 1238 07/09/18 2200 07/10/18 0519 07/11/18 0532 07/12/18 0526  WBC 2.8* 3.2* 4.4 2.3* 2.3*  NEUTROABS  --  1.9 2.9 1.1* 1.0*  HGB 10.8* 10.6* 9.9* 9.1* 9.9*  HCT 33.1* 32.2* 30.1* 27.4* 30.0*  MCV 82.5 83.4 82.7 82.3 82.0  PLT 171 130* 125* 105* 161*   Basic Metabolic Panel: Recent Labs  Lab 07/09/18 1238 07/09/18 2103 07/10/18 0519 07/11/18 0532 07/12/18 0526  NA 138  --  136 137 141  K 4.4  --  4.1 3.9 3.4*  CL 105  --  106 109 108  CO2 21*  --  21* 25 25  GLUCOSE 83  --  70 112* 107*  BUN 54*  --  39* 26* 16  CREATININE 3.42*  --  2.50* 2.12* 1.88*  CALCIUM 9.5  --  8.8* 8.7* 8.7*  MG  --  2.2 2.0 1.8  --   PHOS  --   --  3.1  --   --    GFR: Estimated Creatinine Clearance: 31.3 mL/min (A) (by C-G formula based on SCr of 1.88 mg/dL (H)). Liver Function Tests: Recent Labs  Lab 07/09/18 1238 07/10/18 0519 07/11/18 0532 07/12/18 0526  AST 126* 106* 88* 92*  ALT 120* 101* 90* 96*  ALKPHOS 99 82 72 80  BILITOT 1.4* 1.2 0.9 0.9  PROT 7.6 6.2* 5.5* 5.8*  ALBUMIN 4.1 3.3* 2.9* 2.9*   Recent Labs  Lab 07/09/18 1238 07/11/18 0532  LIPASE 59* 40   No results for input(s): AMMONIA in the last 168 hours. Coagulation Profile: No results for input(s): INR, PROTIME in the last 168 hours. Cardiac Enzymes: Recent Labs  Lab 07/09/18 2103 07/10/18 1152 07/12/18 0526  CKTOTAL 485*  --  366  TROPONINI 0.05* 0.04*  --    BNP (last 3 results) No results for input(s): PROBNP in the last 8760 hours. HbA1C: Recent Labs    07/11/18 0532  HGBA1C 5.4   CBG: No results for input(s): GLUCAP in the last 168 hours. Lipid Profile: Recent Labs    07/11/18 0532  CHOL 88  HDL 25*  LDLCALC 51  TRIG 60  CHOLHDL 3.5   Thyroid Function Tests: Recent Labs    07/10/18 0519  TSH 2.455   Anemia Panel: Recent Labs    07/09/18 2103  VITAMINB12 964*  FOLATE 7.5  FERRITIN  871*  TIBC 184*  IRON 96  RETICCTPCT 1.1   Urine analysis:    Component Value Date/Time   COLORURINE YELLOW 07/09/2018 [place] 07/09/2018 1138   LABSPEC 1.011 07/09/2018 1138   PHURINE 5.0 07/09/2018 1138   GLUCOSEU NEGATIVE 07/09/2018 1138   HGBUR MODERATE (A) 07/09/2018 1138   BILIRUBINUR NEGATIVE 07/09/2018 1138   KETONESUR 5 (A) 07/09/2018 1138   PROTEINUR NEGATIVE 07/09/2018 1138   UROBILINOGEN 0.2 02/26/2008 1900   NITRITE NEGATIVE 07/09/2018 1138   LEUKOCYTESUR NEGATIVE 07/09/2018 1138   Sepsis Labs: @LABRCNTIP (procalcitonin:4,lacticidven:4)  )No results found for this or any previous visit (from the past 240 hour(s)).    Studies: US Abdomen Complete  Result Date: 07/11/2018 CLINICAL DATA:  Elevated LFT with nausea EXAM: ABDOMEN  ULTRASOUND COMPLETE COMPARISON:  Radiograph 07/10/2018, CT 07/09/2018 FINDINGS: Gallbladder: Moderate sludge in the gallbladder without shadowing stone. Negative sonographic Murphy. Wall thickness within normal limits at 3 mm. Small amount of fluid near the gallbladder. Common bile duct: Diameter: 5 mm Liver: No focal lesion identified. Within normal limits in parenchymal echogenicity. Portal vein is patent on color Doppler imaging with normal direction of blood flow towards the liver. IVC: No abnormality visualized. Pancreas: Not seen due to bowel gas. Spleen: Size and appearance within normal limits. Right Kidney: Length: 10.8 cm. No hydronephrosis. Multiple cysts. Largest measured cyst is seen within the mid to upper pole and measures 2.8 x 2.7 x 2.7 cm Left Kidney: Length: 11.8 cm. No hydronephrosis. Multiple cysts. Largest measured cyst in the upper pole measures 8.9 x 7.4 x 7.2 cm. Abdominal aorta: Maximum AP diameter proximally of 2.9 cm. Aortic atherosclerosis. Other findings: Small amount of peri hepatic ascites. IMPRESSION: 1. No focal hepatic abnormality. Negative for biliary dilatation by sonography. 2. Gallbladder sludge  without wall thickening or sonographic Murphy. 3. Small amount of fluid near the gallbladder, suspect that this is related to generalized small amount of perihepatic ascites. 4. Multiple cysts within the kidneys. Electronically Signed   By: Donavan Foil M.D.   On: 07/11/2018 17:32    Scheduled Meds: . brinzolamide  1 drop Both Eyes TID  . chlorhexidine  15 mL Mouth Rinse BID  . feeding supplement (ENSURE ENLIVE)  237 mL Oral BID BM  . latanoprost  1 drop Both Eyes QHS  . mouth rinse  15 mL Mouth Rinse q12n4p  . mirtazapine  7.5 mg Oral QHS  . nystatin  5 mL Oral QID  . timolol  1 drop Both Eyes Daily    Continuous Infusions: . dextrose 5 % and 0.45% NaCl 100 mL/hr at 07/12/18 6147     LOS: 3 days     Alma Friendly, MD [facility]   If 7PM-7AM, please contact night-coverage [email or web address] Password [facility] 07/12/2018, 3:15 PM

## 2018-07-12 NOTE — Care Management Note (Signed)
Case Management Note  Patient Details  Name: Matthew Winters MRN: [id number] Date of Birth: [birth year]  Subjective/Objective:                  Nausea and gb sludge on films/advancing to reg diet today  Action/Plan: Following for cm needs Expected Discharge Date:                  Expected Discharge Plan:  Home/Self Care  In-House Referral:     Discharge planning Services  CM Consult  Post Acute Care Choice:    Choice offered to:     DME Arranged:    DME Agency:     HH Arranged:    HH Agency:     Status of Service:  In process, will continue to follow  If discussed at Long Length of Stay Meetings, dates discussed:    Additional Comments:  Matthew Winters, [place], RN 07/12/2018, 12:59 PM

## 2018-07-12 NOTE — Progress Notes (Addendum)
Patient ID: Matthew Winters, male   DOB: Jan 17, 1936, 82 y.o.   MRN: [id number]    Progress Note   Subjective    Feeling a little better - on liquids - says has no taste and food tastes bad - no nausea, no vomiting, no abdominal pain US - GB sludge, normal CBD, liver ok, pancreas not visualized    Objective   Vital signs in last 24 hours: Temp:  [98.2 F (36.8 C)-98.9 F (37.2 C)] 98.2 F (36.8 C) (08/13 0437) Pulse Rate:  [64-84] 84 (08/13 0828) Resp:  [10-20] 10 (08/13 0828) BP: (93-111)/(59-71) 100/68 (08/13 0828) SpO2:  [96 %-100 %] 98 % (08/13 0828) Last BM Date: 07/10/18 General:   Elderly AA male in NAD Heart:  Regular rate and rhythm; no murmurs Lungs: Respirations even and unlabored, lungs CTA bilaterally Abdomen:  Soft, nontender and nondistended. Normal bowel sounds. Extremities:  Without edema. Neurologic:  Alert and oriented,  grossly normal neurologically. Psych:  Cooperative. Normal mood and affect.  Intake/Output from previous day: 08/12 0701 - 08/13 0700 In: 60 [P.O.:60] Out: 2850 [Urine:2850] Intake/Output this shift: No intake/output data recorded.  Lab Results: Recent Labs    07/10/18 0519 07/11/18 0532 07/12/18 0526  WBC 4.4 2.3* 2.3*  HGB 9.9* 9.1* 9.9*  HCT 30.1* 27.4* 30.0*  PLT 125* 105* 107*   BMET Recent Labs    07/10/18 0519 07/11/18 0532 07/12/18 0526  NA 136 137 141  K 4.1 3.9 3.4*  CL 106 109 108  CO2 21* 25 25  GLUCOSE 70 112* 107*  BUN 39* 26* 16  CREATININE 2.50* 2.12* 1.88*  CALCIUM 8.8* 8.7* 8.7*   LFT Recent Labs    07/12/18 0526  PROT 5.8*  ALBUMIN 2.9*  AST 92*  ALT 96*  ALKPHOS 80  BILITOT 0.9   PT/INR No results for input(s): LABPROT, INR in the last 72 hours.  Studies/Results: US Abdomen Complete  Result Date: 07/11/2018 CLINICAL DATA:  Elevated LFT with nausea EXAM: ABDOMEN ULTRASOUND COMPLETE COMPARISON:  Radiograph 07/10/2018, CT 07/09/2018 FINDINGS: Gallbladder: Moderate sludge in the gallbladder  without shadowing stone. Negative sonographic Murphy. Wall thickness within normal limits at 3 mm. Small amount of fluid near the gallbladder. Common bile duct: Diameter: 5 mm Liver: No focal lesion identified. Within normal limits in parenchymal echogenicity. Portal vein is patent on color Doppler imaging with normal direction of blood flow towards the liver. IVC: No abnormality visualized. Pancreas: Not seen due to bowel gas. Spleen: Size and appearance within normal limits. Right Kidney: Length: 10.8 cm. No hydronephrosis. Multiple cysts. Largest measured cyst is seen within the mid to upper pole and measures 2.8 x 2.7 x 2.7 cm Left Kidney: Length: 11.8 cm. No hydronephrosis. Multiple cysts. Largest measured cyst in the upper pole measures 8.9 x 7.4 x 7.2 cm. Abdominal aorta: Maximum AP diameter proximally of 2.9 cm. Aortic atherosclerosis. Other findings: Small amount of peri hepatic ascites. IMPRESSION: 1. No focal hepatic abnormality. Negative for biliary dilatation by sonography. 2. Gallbladder sludge without wall thickening or sonographic Murphy. 3. Small amount of fluid near the gallbladder, suspect that this is related to generalized small amount of perihepatic ascites. 4. Multiple cysts within the kidneys. Electronically Signed   By: Donavan Foil M.D.   On: 07/11/2018 17:32   Dg Abd Portable 2v  Result Date: 07/10/2018 CLINICAL DATA:  Order requisition states vomiting, but pt states he does not have any abdominal complaints. Reports 2-3 days ago his BM was black but unsure  about details. EXAM: PORTABLE ABDOMEN - 2 VIEW COMPARISON:  CT, 07/09/2018 FINDINGS: There is no bowel dilation to suggest obstruction. Residual contrast is noted in the colon. There are few colonic air-fluid levels on the decubitus view. No free air. IMPRESSION: 1. No evidence of bowel obstruction or free air. 2. Few colonic air-fluid levels, nonspecific. No findings of a significant adynamic ileus. Electronically Signed   By:  Lajean Manes M.D.   On: 07/10/2018 11:44       Assessment / Plan:     #1 82 yo AA male with weakness, anorexia, admitted with inability to eat x 4-5 days and dehydration with AKI.     He is not nauseated - just no appetite, food tastes  bad - likely secondary to thrush  #2 mild  transaminitis - improving , Hepatitis panel negative  ? Viral syndrome , ?consider rickettsial with pancytopenia #3 pancytopenia  #4 CAD #5 hx gout #6 CKD  Plan: Start Mycostatin oral suspension QID Advance diet to regular - he wants to try  Observe  If fails to improve eventually will need CT with IV contrast - not yet    LOS: 3 days   Amy Esterwood  07/12/2018, 9:02 AM     Attending physician's note   I have taken an interval history, reviewed the chart and examined the patient. I agree with the Advanced Practitioner's note, impression and recommendations. Treat thrush. Encourage Ensure and PO intake.   Lucio Edward, MD FACG [phone] office

## 2018-07-12 NOTE — Progress Notes (Addendum)
Initial Nutrition Assessment  DOCUMENTATION CODES:   Not applicable  INTERVENTION:    Ensure Enlive po BID, each supplement provides 350 kcal and 20 grams of protein  Magic cup TID with meals, each supplement provides 290 kcal and 9 grams of protein  NUTRITION DIAGNOSIS:   Inadequate oral intake related to decreased appetite(thrush) as evidenced by per patient/family report.  GOAL:   Patient will meet greater than or equal to 90% of their needs  MONITOR:   PO intake, Supplement acceptance, Weight trends, Labs  REASON FOR ASSESSMENT:   Consult Assessment of nutrition requirement/status  ASSESSMENT:   Patient with PMH significant for CKD, CKD III, CAD, HLD, HTN, glaucoma, and gout. Presents this admission with acute urinary rentention, AKI, and adynamic ileus.    8/11- KUB shows no evidence of ileus or obstruction   Pt reports intake decreased to bites only this week due to taste changes. He describes food as having "no taste.", causing him not to want anything  States he typically eats 2-3 meals/day that consist of meats, eggs, grits, vegetables, and oatmeal. Pt shows to have probable thrush and is currently being treated with Magic Mouth wash QID. Denies any swallowing issues associated with this. Pt is currently on a regular diet but hasn't had his first meal this morning. Discussed protein options pt could have during admission. Pt amendable to Ensure.   Pt endorses a UBW of 180 lb and denies any recent wt loss. Records are limited in current wt history but show pt weighing 178 lb two years ago. Nutrition-Focused physical exam completed.   Medications reviewed and include: Remeron, D5 @ 100 ml/hr Labs reviewed: K 3.4 (L)   NUTRITION - FOCUSED PHYSICAL EXAM:    Most Recent Value  Orbital Region  No depletion  Upper Arm Region  Moderate depletion  Thoracic and Lumbar Region  Unable to assess  Buccal Region  No depletion  Temple Region  Moderate depletion  Clavicle  Bone Region  Severe depletion  Clavicle and Acromion Bone Region  Moderate depletion  Scapular Bone Region  Unable to assess  Dorsal Hand  Mild depletion  Patellar Region  Moderate depletion  Anterior Thigh Region  Moderate depletion  Posterior Calf Region  Moderate depletion  Edema (RD Assessment)  None  Hair  Reviewed  Eyes  Reviewed  Mouth  Reviewed  Skin  Reviewed  Nails  Reviewed     Diet Order:   Diet Order            Diet regular Room service appropriate? Yes; Fluid consistency: Thin  Diet effective now              EDUCATION NEEDS:   Education needs have been addressed  Skin:  Skin Assessment: Reviewed RN Assessment  Last BM:  07/10/18  Height:   Ht Readings from Last 1 Encounters:  07/09/18 5\' 6"  (1.676 m)    Weight:   Wt Readings from Last 1 Encounters:  07/09/18 80.8 kg    Ideal Body Weight:  64.5 kg  BMI:  Body mass index is 28.75 kg/m.  Estimated Nutritional Needs:   Kcal:  2000-2200 kcal  Protein:  100-115 grams  Fluid:  >/= 2 L/day   Mariana Single RD, LDN Clinical Nutrition Pager # - [phone]

## 2018-07-13 DIAGNOSIS — R630 Anorexia: Secondary | ICD-10-CM

## 2018-07-13 DIAGNOSIS — R748 Abnormal levels of other serum enzymes: Secondary | ICD-10-CM

## 2018-07-13 LAB — CBC WITH DIFFERENTIAL/PLATELET
BASOS ABS: 0 10*3/uL (ref 0.0–0.1)
BASOS PCT: 0 %
EOS ABS: 0 10*3/uL (ref 0.0–0.7)
Eosinophils Relative: 1 %
HEMATOCRIT: 29.5 % — AB (ref 39.0–52.0)
Hemoglobin: 9.6 g/dL — ABNORMAL LOW (ref 13.0–17.0)
Lymphocytes Relative: 44 %
Lymphs Abs: 1.1 10*3/uL (ref 0.7–4.0)
MCH: 27 pg (ref 26.0–34.0)
MCHC: 32.5 g/dL (ref 30.0–36.0)
MCV: 83.1 fL (ref 78.0–100.0)
Monocytes Absolute: 0.3 10*3/uL (ref 0.1–1.0)
Monocytes Relative: 12 %
NEUTROS ABS: 1.1 10*3/uL — AB (ref 1.7–7.7)
NEUTROS PCT: 43 %
Platelets: 107 10*3/uL — ABNORMAL LOW (ref 150–400)
RBC: 3.55 MIL/uL — AB (ref 4.22–5.81)
RDW: 15.8 % — ABNORMAL HIGH (ref 11.5–15.5)
WBC: 2.5 10*3/uL — AB (ref 4.0–10.5)

## 2018-07-13 LAB — COMPREHENSIVE METABOLIC PANEL
ALBUMIN: 2.7 g/dL — AB (ref 3.5–5.0)
ALK PHOS: 75 U/L (ref 38–126)
ALT: 84 U/L — ABNORMAL HIGH (ref 0–44)
AST: 73 U/L — AB (ref 15–41)
Anion gap: 5 (ref 5–15)
BILIRUBIN TOTAL: 0.6 mg/dL (ref 0.3–1.2)
BUN: 14 mg/dL (ref 8–23)
CALCIUM: 8.4 mg/dL — AB (ref 8.9–10.3)
CO2: 24 mmol/L (ref 22–32)
Chloride: 108 mmol/L (ref 98–111)
Creatinine, Ser: 1.86 mg/dL — ABNORMAL HIGH (ref 0.61–1.24)
GFR calc Af Amer: 38 mL/min — ABNORMAL LOW (ref >60)
GFR calc non Af Amer: 33 mL/min — ABNORMAL LOW (ref >60)
GLUCOSE: 107 mg/dL — AB (ref 70–99)
Potassium: 3.4 mmol/L — ABNORMAL LOW (ref 3.5–5.1)
SODIUM: 137 mmol/L (ref 135–145)
TOTAL PROTEIN: 5.4 g/dL — AB (ref 6.5–8.1)

## 2018-07-13 NOTE — Care Management Note (Signed)
Case Management Note  Patient Details  Name: Matthew Winters MRN: [id number] Date of Birth: 20-Jul-1936  Subjective/Objective:                  Improved able to eat solid foods/ gi seen and signed off/  Action/Plan: Will follow for possible dc to home today/no cm needs present at this time.  Expected Discharge Date:                  Expected Discharge Plan:  Home/Self Care  In-House Referral:     Discharge planning Services  CM Consult  Post Acute Care Choice:    Choice offered to:     DME Arranged:    DME Agency:     HH Arranged:    HH Agency:     Status of Service:  In process, will continue to follow  If discussed at Long Length of Stay Meetings, dates discussed:    Additional Comments:  [name], [name], RN 07/13/2018, 11:00 AM

## 2018-07-13 NOTE — Progress Notes (Addendum)
Patient ID: Matthew Winters, male   DOB: September 28, 1936, 82 y.o.   MRN: [id number]    Progress Note   Subjective   Feeling" better" -  able to eat some solid food yesterday - mouth slowly improving - no c/o nausea or abdominal  pain   Objective   Vital signs in last 24 hours: Temp:  [98.2 F (36.8 C)-98.6 F (37 C)] 98.6 F (37 C) (08/14 0544) Pulse Rate:  [65-78] 78 (08/14 0544) Resp:  [14-19] 19 (08/14 0544) BP: (98-127)/(59-79) 98/59 (08/14 0544) SpO2:  [94 %-100 %] 94 % (08/14 0544) Last BM Date: 07/10/18 General:  elderly  AA male  in NAD Heart:  Regular rate and rhythm; no murmurs Lungs: Respirations even and unlabored, lungs CTA bilaterally Abdomen:  Soft, nontender and nondistended. Normal bowel sounds. Extremities:  Without edema. Neurologic:  Alert and oriented,  grossly normal neurologically. Psych:  Cooperative. Normal mood and affect.  Intake/Output from previous day: 08/13 0701 - 08/14 0700 In: 1120 [P.O.:120; I.V.:1000] Out: 350 [Urine:350] Intake/Output this shift: No intake/output data recorded.  Lab Results: Recent Labs    07/11/18 0532 07/12/18 0526 07/13/18 0553  WBC 2.3* 2.3* 2.5*  HGB 9.1* 9.9* 9.6*  HCT 27.4* 30.0* 29.5*  PLT 105* 107* 107*   BMET Recent Labs    07/11/18 0532 07/12/18 0526 07/13/18 0553  NA 137 141 137  K 3.9 3.4* 3.4*  CL 109 108 108  CO2 25 25 24   GLUCOSE 112* 107* 107*  BUN 26* 16 14  CREATININE 2.12* 1.88* 1.86*  CALCIUM 8.7* 8.7* 8.4*   LFT Recent Labs    07/13/18 0553  PROT 5.4*  ALBUMIN 2.7*  AST 73*  ALT 84*  ALKPHOS 75  BILITOT 0.6   PT/INR No results for input(s): LABPROT, INR in the last 72 hours.  Studies/Results: US Abdomen Complete  Result Date: 07/11/2018 CLINICAL DATA:  Elevated LFT with nausea EXAM: ABDOMEN ULTRASOUND COMPLETE COMPARISON:  Radiograph 07/10/2018, CT 07/09/2018 FINDINGS: Gallbladder: Moderate sludge in the gallbladder without shadowing stone. Negative sonographic Murphy. Wall  thickness within normal limits at 3 mm. Small amount of fluid near the gallbladder. Common bile duct: Diameter: 5 mm Liver: No focal lesion identified. Within normal limits in parenchymal echogenicity. Portal vein is patent on color Doppler imaging with normal direction of blood flow towards the liver. IVC: No abnormality visualized. Pancreas: Not seen due to bowel gas. Spleen: Size and appearance within normal limits. Right Kidney: Length: 10.8 cm. No hydronephrosis. Multiple cysts. Largest measured cyst is seen within the mid to upper pole and measures 2.8 x 2.7 x 2.7 cm Left Kidney: Length: 11.8 cm. No hydronephrosis. Multiple cysts. Largest measured cyst in the upper pole measures 8.9 x 7.4 x 7.2 cm. Abdominal aorta: Maximum AP diameter proximally of 2.9 cm. Aortic atherosclerosis. Other findings: Small amount of peri hepatic ascites. IMPRESSION: 1. No focal hepatic abnormality. Negative for biliary dilatation by sonography. 2. Gallbladder sludge without wall thickening or sonographic Murphy. 3. Small amount of fluid near the gallbladder, suspect that this is related to generalized small amount of perihepatic ascites. 4. Multiple cysts within the kidneys. Electronically Signed   By: Donavan Foil M.D.   On: 07/11/2018 17:32   Dg Chest Port 1 View  Result Date: 07/12/2018 CLINICAL DATA:  Initial evaluation for pancytopenia. EXAM: PORTABLE CHEST 1 VIEW COMPARISON:  Prior radiograph from 07/20/2012. FINDINGS: Mild cardiomegaly, stable. Mediastinal silhouette within normal limits. Aortic atherosclerosis. Lungs hypoinflated. Secondary mild bibasilar subsegmental atelectatic changes. No  focal infiltrates or consolidative airspace disease. No pulmonary edema or pleural effusion. IMPRESSION: 1. Shallow lung inflation with secondary mild bibasilar subsegmental atelectasis. 2. No other active cardiopulmonary disease. 3. Aortic atherosclerosis. Electronically Signed   By: Jeannine Boga M.D.   On: 07/12/2018  15:51      Assessment / Recommendations:    #1 82 yo AA male admitted with 4 day hx of anorexia, weakness, no po intake -  etiology still not clear - ? viral, med rxn, malignancy, ? other. Improving, eating solid food   #2 oral thrush - continue Mycostatin oral suspension QID x 2 weeks, oral care TID #3 AKI - improving slowly #4 pancytopenia stable #5 mild transaminitis - improving   Recommendations:  No endoscopic procedures planned at present Continue current regimen Trend LFTs, CBC, BMET Will need CT abd/pelvis with contrast or MRI abd/pelvis with contrast when renal fxn allows and if findings require further GI input please contact us. GI signing off.    LOS: 4 days   Amy Esterwood  07/13/2018, 8:54 AM     Attending physician's note   I have taken an interval history, reviewed the chart and examined the patient. I agree with the Advanced Practitioner's note, impression and recommendations. Trend CMET, CBC. Continue Mycostatin for 2 weeks. Schedule CT abd/pelvis with contrast or MRI abd/pelvis with contrast when renal function appropriate and if findings require further GI input please contact us. GI signing off for now.   Lucio Edward, MD FACG [phone] office

## 2018-07-13 NOTE — Progress Notes (Signed)
PROGRESS NOTE  Matthew Winters PJK:[id number] DOB: 1936/10/29 DOA: 07/09/2018 PCP: [name], MD  HPI/Recap of past 51 hours: 82 year old male with medical history significant for CKD, CAD, hypertension, gout, who was brought in by family complaining of vomiting and dizziness for the past 4 days.  Patient also reported poor appetite, and has not been able to keep anything down for those days.  Patient denied any abdominal pain, fever/chills, diarrhea, chest pain, dysuria.  In the ED, pt was noted to be orthostatic positive, CT head unremarkable, CT abdomen/pelvis showed adynamic ileus.  Patient was also noted to have some AKI and urinary retention, Foley was placed in the ED.  Patient admitted for further management  Today, patient feeling better, not passing gas, but had a bm in afternoon. Tolerating oral diet in lunch. Unable to void so far after removal of the catheter.  Assessment/Plan: Unlikely adynamic ileus/Poor appetite/Oral thrush On presentation, complained of vomiting, poor appetite Afebrile, no leukocytosis LFTs, lipase noted to be elevated, will trend CT abdomen pelvis showed possible mild adynamic ileus. This is suggested by moderate Stomach distension and scattered air-fluid levels within the small bowel.  There is no bowel dilation to suggest obstruction. No bowel inflammation Repeat Abd xray unremarkable USS of Abdomen: Unremarkable GI consulted: Appreciate recs, plan for CT abd/pelvis with contrast once renal fxn improves, altthough I suspect that his renal function will ever be better to attempt any contrast safely. Will D/w GI regarding other modalities.  Dietician consulted Continue Nystatin, Remeron Hold IV fluids, regular diet and monitor   Elevated LFTs Denies any alcohol use Unknown etiology, ??hepatitis, ??underlying gallbladder dsz ??malignancy ??statins Hepatitis panel negative CT abdomen/pelvis: No abnormality of the liver mentioned GI on board Daily  CMP  AKI on CKD stage 3 Improving Unknown recent baseline, as last Cr 2.5 in 2014 On admission Cr 3.42-->2.5-->2.12-->1.88 Likely due to poor oral intake Vs obstructive uropathy UA negative Hold hydration and monitor renal function  Avoid nephrotoxics, renally dose meds Daily CMP  Pancytopenia ANC 1.0 Unknown etiology, ??malignanacy Requested peripheral blood smear review May need pan CT abd/pelvis/chest with IV once able Daily CBC, monitor closely  Acute urinary retention Noted to be retaining urine in ED S/P foley, stared voiding trial, monitor overnight  Was started on flomax during this admission, will d/c due to significant orthostatsis Monitor closely  Orthostatic positive  Still Ongoing Likely due to poor oral intake/hydration Daily orthostatics Continue IVF  Anemia of CKD Unknown baseline, no signs of bleeding FOBT negative Daily cbc  Elevated troponin/Hx of CAD Chest pain free Flat trend, EKG NSR Monitor  HTN BP soft Hold home norvasc  Gout  Continue home meds     Code Status: Full  Family Communication: None at bedside  Disposition Plan: Once work up complete   Consultants:  GI   Procedures:  None  Antimicrobials:  None   DVT prophylaxis:  SCDs   Objective: Vitals:   07/12/18 1456 07/12/18 1947 07/13/18 0544 07/13/18 1446  BP: 127/79 118/75 (!) 98/59 108/79  Pulse: 65 68 78 81  Resp: 14 15 19  (!) 22  Temp: 98.2 F (36.8 C) 98.6 F (37 C) 98.6 F (37 C) 98.4 F (36.9 C)  TempSrc:  Oral Oral Oral  SpO2: 100% 96% 94% 99%  Weight:      Height:        Intake/Output Summary (Last 24 hours) at 07/13/2018 1757 Last data filed at 07/13/2018 0900 Gross per 24 hour  Intake 1300 ml  Output -  Net 1300 ml   Filed Weights   07/09/18 1223  Weight: 80.8 kg    Exam:   General: NAD, chronically ill appearing  Cardiovascular: S1, S2 present  Respiratory: CTAB   Abdomen: Soft, NT, ND, BS present  Musculoskeletal: No  pedal edema bilaterally  Skin: Normal  Psychiatry: Normal mood, denies any feeling of depression   Data Reviewed: CBC: Recent Labs  Lab 07/09/18 2200 07/10/18 0519 07/11/18 0532 07/12/18 0526 07/13/18 0553  WBC 3.2* 4.4 2.3* 2.3* 2.5*  NEUTROABS 1.9 2.9 1.1* 1.0* 1.1*  HGB 10.6* 9.9* 9.1* 9.9* 9.6*  HCT 32.2* 30.1* 27.4* 30.0* 29.5*  MCV 83.4 82.7 82.3 82.0 83.1  PLT 130* 125* 105* 107* 580*   Basic Metabolic Panel: Recent Labs  Lab 07/09/18 1238 07/09/18 2103 07/10/18 0519 07/11/18 0532 07/12/18 0526 07/13/18 0553  NA 138  --  136 137 141 137  K 4.4  --  4.1 3.9 3.4* 3.4*  CL 105  --  106 109 108 108  CO2 21*  --  21* 25 25 24   GLUCOSE 83  --  70 112* 107* 107*  BUN 54*  --  39* 26* 16 14  CREATININE 3.42*  --  2.50* 2.12* 1.88* 1.86*  CALCIUM 9.5  --  8.8* 8.7* 8.7* 8.4*  MG  --  2.2 2.0 1.8  --   --   PHOS  --   --  3.1  --   --   --    GFR: Estimated Creatinine Clearance: 31.6 mL/min (A) (by C-G formula based on SCr of 1.86 mg/dL (H)). Liver Function Tests: Recent Labs  Lab 07/09/18 1238 07/10/18 0519 07/11/18 0532 07/12/18 0526 07/13/18 0553  AST 126* 106* 88* 92* 73*  ALT 120* 101* 90* 96* 84*  ALKPHOS 99 82 72 80 75  BILITOT 1.4* 1.2 0.9 0.9 0.6  PROT 7.6 6.2* 5.5* 5.8* 5.4*  ALBUMIN 4.1 3.3* 2.9* 2.9* 2.7*   Recent Labs  Lab 07/09/18 1238 07/11/18 0532  LIPASE 59* 40   No results for input(s): AMMONIA in the last 168 hours. Coagulation Profile: No results for input(s): INR, PROTIME in the last 168 hours. Cardiac Enzymes: Recent Labs  Lab 07/09/18 2103 07/10/18 1152 07/12/18 0526  CKTOTAL 485*  --  366  TROPONINI 0.05* 0.04*  --    BNP (last 3 results) No results for input(s): PROBNP in the last 8760 hours. HbA1C: Recent Labs    07/11/18 0532  HGBA1C 5.4   CBG: No results for input(s): GLUCAP in the last 168 hours. Lipid Profile: Recent Labs    07/11/18 0532  CHOL 88  HDL 25*  LDLCALC 51  TRIG 60  CHOLHDL 3.5    Thyroid Function Tests: No results for input(s): TSH, T4TOTAL, FREET4, T3FREE, THYROIDAB in the last 72 hours. Anemia Panel: No results for input(s): VITAMINB12, FOLATE, FERRITIN, TIBC, IRON, RETICCTPCT in the last 72 hours. Urine analysis:    Component Value Date/Time   COLORURINE YELLOW 07/09/2018 1138   APPEARANCEUR CLEAR 07/09/2018 1138   LABSPEC 1.011 07/09/2018 1138   PHURINE 5.0 07/09/2018 1138   GLUCOSEU NEGATIVE 07/09/2018 1138   HGBUR MODERATE (A) 07/09/2018 1138   BILIRUBINUR NEGATIVE 07/09/2018 1138   KETONESUR 5 (A) 07/09/2018 1138   PROTEINUR NEGATIVE 07/09/2018 1138   UROBILINOGEN 0.2 02/26/2008 1900   NITRITE NEGATIVE 07/09/2018 1138   LEUKOCYTESUR NEGATIVE 07/09/2018 1138   Sepsis Labs: @LABRCNTIP (procalcitonin:4,lacticidven:4)  )No results found for this or any previous visit (from  the past 240 hour(s)).    Studies: No results found.  Scheduled Meds: . brinzolamide  1 drop Both Eyes TID  . chlorhexidine  15 mL Mouth Rinse BID  . feeding supplement (ENSURE ENLIVE)  237 mL Oral BID BM  . latanoprost  1 drop Both Eyes QHS  . mouth rinse  15 mL Mouth Rinse q12n4p  . mirtazapine  7.5 mg Oral QHS  . nystatin  5 mL Oral QID  . timolol  1 drop Both Eyes Daily    Continuous Infusions:    LOS: 4 days     Berle Mull, MD [facility]   If 7PM-7AM, please contact night-coverage [email or web address] Password [facility] 07/13/2018, 5:57 PM

## 2018-07-14 LAB — CBC WITH DIFFERENTIAL/PLATELET
BASOS PCT: 0 %
Basophils Absolute: 0 10*3/uL (ref 0.0–0.1)
Eosinophils Absolute: 0 10*3/uL (ref 0.0–0.7)
Eosinophils Relative: 1 %
HEMATOCRIT: 28.4 % — AB (ref 39.0–52.0)
HEMOGLOBIN: 9.4 g/dL — AB (ref 13.0–17.0)
LYMPHS PCT: 44 %
Lymphs Abs: 1.3 10*3/uL (ref 0.7–4.0)
MCH: 27.6 pg (ref 26.0–34.0)
MCHC: 33.1 g/dL (ref 30.0–36.0)
MCV: 83.3 fL (ref 78.0–100.0)
Monocytes Absolute: 0.4 10*3/uL (ref 0.1–1.0)
Monocytes Relative: 15 %
NEUTROS ABS: 1.2 10*3/uL — AB (ref 1.7–7.7)
NEUTROS PCT: 40 %
Platelets: 109 10*3/uL — ABNORMAL LOW (ref 150–400)
RBC: 3.41 MIL/uL — AB (ref 4.22–5.81)
RDW: 16.1 % — ABNORMAL HIGH (ref 11.5–15.5)
WBC: 3 10*3/uL — ABNORMAL LOW (ref 4.0–10.5)

## 2018-07-14 LAB — COMPREHENSIVE METABOLIC PANEL
ALK PHOS: 73 U/L (ref 38–126)
ALT: 73 U/L — AB (ref 0–44)
AST: 58 U/L — AB (ref 15–41)
Albumin: 2.9 g/dL — ABNORMAL LOW (ref 3.5–5.0)
Anion gap: 5 (ref 5–15)
BILIRUBIN TOTAL: 0.6 mg/dL (ref 0.3–1.2)
BUN: 20 mg/dL (ref 8–23)
CALCIUM: 8.8 mg/dL — AB (ref 8.9–10.3)
CO2: 26 mmol/L (ref 22–32)
CREATININE: 1.94 mg/dL — AB (ref 0.61–1.24)
Chloride: 109 mmol/L (ref 98–111)
GFR calc Af Amer: 36 mL/min — ABNORMAL LOW (ref >60)
GFR, EST NON AFRICAN AMERICAN: 31 mL/min — AB (ref >60)
GLUCOSE: 93 mg/dL (ref 70–99)
POTASSIUM: 3.9 mmol/L (ref 3.5–5.1)
Sodium: 140 mmol/L (ref 135–145)
TOTAL PROTEIN: 5.6 g/dL — AB (ref 6.5–8.1)

## 2018-07-14 MED ORDER — PSYLLIUM 95 % PO PACK
1.0000 | PACK | Freq: Once | ORAL | Status: AC
  Administered 2018-07-14: 1 via ORAL
  Filled 2018-07-14: qty 1

## 2018-07-14 MED ORDER — POLYETHYLENE GLYCOL 3350 17 G PO PACK
17.0000 g | PACK | Freq: Every day | ORAL | Status: DC
  Administered 2018-07-14 – 2018-07-16 (×3): 17 g via ORAL
  Filled 2018-07-14 (×3): qty 1

## 2018-07-14 MED ORDER — DOCUSATE SODIUM 100 MG PO CAPS
100.0000 mg | ORAL_CAPSULE | Freq: Two times a day (BID) | ORAL | Status: DC
  Administered 2018-07-14 – 2018-07-15 (×3): 100 mg via ORAL
  Filled 2018-07-14 (×3): qty 1

## 2018-07-14 MED ORDER — METOCLOPRAMIDE HCL 5 MG/ML IJ SOLN
5.0000 mg | Freq: Three times a day (TID) | INTRAMUSCULAR | Status: DC
  Administered 2018-07-14 – 2018-07-15 (×3): 5 mg via INTRAVENOUS
  Filled 2018-07-14 (×3): qty 2

## 2018-07-14 MED ORDER — SODIUM CHLORIDE 0.9 % IV SOLN
INTRAVENOUS | Status: DC
  Administered 2018-07-14 – 2018-07-15 (×2): via INTRAVENOUS

## 2018-07-14 NOTE — Plan of Care (Signed)
Plan of care discussed.   

## 2018-07-14 NOTE — Progress Notes (Addendum)
Patient ID: Matthew Winters, male   DOB: August 21, 1936, 82 y.o.   MRN: [id number]  Asked to comment on further imaging Creat 1.9  Discussed with Dr Fuller Plan. Once pt tolerating diet he can follow up with his PCP, does not have to have further abdominal imaging here. PCP can decide regarding repeat CT or noncontrasted MRI as outpt.

## 2018-07-14 NOTE — Care Management Note (Signed)
Case Management Note  Patient Details  Name: Matthew Winters MRN: [id number] Date of Birth: 04/18/1936  Subjective/Objective:                  G.i. Signed off will f/u with mri as an outpt/  Action/Plan: dcd to home today?/will wait on medical attending to see.  Expected Discharge Date:                  Expected Discharge Plan:  Home/Self Care  In-House Referral:     Discharge planning Services  CM Consult  Post Acute Care Choice:    Choice offered to:     DME Arranged:    DME Agency:     HH Arranged:    [place] Agency:     Status of Service:  Completed, signed off  If discussed at [employer] of Stay Meetings, dates discussed:    Additional Comments:  Osias, Resnick, RN 07/14/2018, 12:28 PM

## 2018-07-14 NOTE — Progress Notes (Signed)
Physical Therapy Treatment Patient Details Name: Matthew Winters MRN: [id number] DOB: 22-Oct-1936 Today's Date: 07/14/2018    History of Present Illness 82 year old male with medical history significant for CKD, CAD, hypertension, gout, who was brought in by family complaining of vomiting and dizziness; CT abdomen pelvis showed possible mild adynamic ileus    PT Comments    Pt ambulated in hallway and reports mild dizziness but felt able to continue.  Pt requested no assistive device today so ambulated without to challenge pt however would recommend continuing to use RW upon d/c home for safety at this time.   Follow Up Recommendations  Home health PT     Equipment Recommendations  Rolling walker with 5" wheels    Recommendations for Other Services       Precautions / Restrictions Precautions Precautions: Fall    Mobility  Bed Mobility Overal bed mobility: Needs Assistance Bed Mobility: Supine to Sit;Sit to Supine     Supine to sit: Supervision Sit to supine: Supervision   General bed mobility comments: increased time  Transfers Overall transfer level: Needs assistance Equipment used: Rolling walker (2 wheeled) Transfers: Sit to/from Stand Sit to Stand: Min guard         General transfer comment: min/guard for safety, mild c/o dizziness  Ambulation/Gait Ambulation/Gait assistance: Min guard Gait Distance (Feet): 240 Feet Assistive device: None Gait Pattern/deviations: Step-through pattern;Decreased stride length     General Gait Details: pt wished to ambulate without assistive device so min/guard for safety, pt with unsteadiness however no overt LOB or assist required; distance to tolerance; pt reports mild dizziness which did not worsen nor resolve; BP obtained after ambulating and in docflowsheets (was not too low)   Chief Strategy Officer    Modified Rankin (Stroke Patients Only)       Balance Overall balance assessment: Needs  assistance         Standing balance support: No upper extremity supported Standing balance-Leahy Scale: Fair                              Cognition Arousal/Alertness: Awake/alert Behavior During Therapy: Flat affect Overall Cognitive Status: Within Functional Limits for tasks assessed                                        Exercises      General Comments        Pertinent Vitals/Pain Pain Assessment: No/denies pain    Home Living                      Prior Function            PT Goals (current goals can now be found in the care plan section) Progress towards PT goals: Progressing toward goals    Frequency    Min 3X/week      PT Plan Current plan remains appropriate    Co-evaluation              AM-PAC PT "6 Clicks" Daily Activity  Outcome Measure  Difficulty turning over in bed (including adjusting bedclothes, sheets and blankets)?: A Little Difficulty moving from lying on back to sitting on the side of the bed? : A Little Difficulty sitting down on and standing up from a chair  with arms (e.g., wheelchair, bedside commode, etc,.)?: A Little Help needed moving to and from a bed to chair (including a wheelchair)?: A Little Help needed walking in hospital room?: A Little Help needed climbing 3-5 steps with a railing? : A Little 6 Click Score: 18    End of Session Equipment Utilized During Treatment: Gait belt Activity Tolerance: Patient tolerated treatment well Patient left: in bed;with bed alarm set;with call bell/phone within reach Nurse Communication: Mobility status PT Visit Diagnosis: Difficulty in walking, not elsewhere classified (R26.2)     Time: 9924-2683 PT Time Calculation (min) (ACUTE ONLY): 13 min  Charges:  $Gait Training: 8-22 mins                     Carmelia Bake, PT, DPT 07/14/2018 Pager: [phone]  York Ram E 07/14/2018, 1:10 PM

## 2018-07-14 NOTE — Progress Notes (Signed)
PROGRESS NOTE  Matthew Winters EQA:[id number] DOB: [birth year] DOA: 07/09/2018 PCP: [name], MD  HPI/Recap of past 16 hours: 82 year old male with medical history significant for CKD, CAD, hypertension, gout, who was brought in by family complaining of vomiting and dizziness for the past 4 days.  Patient also reported poor appetite, and has not been able to keep anything down for those days.  Patient denied any abdominal pain, fever/chills, diarrhea, chest pain, dysuria.  In the ED, pt was noted to be orthostatic positive, CT head unremarkable, CT abdomen/pelvis showed adynamic ileus.  Patient was also noted to have some AKI and urinary retention, Foley was placed in the ED.  Patient admitted for further management  Today, patient feeling about the same, minimal oral intake.  No nausea no vomiting.  Had a small BM.  Passing gas.  Assessment/Plan: Unlikely adynamic ileus/Poor appetite/Oral thrush On presentation, complained of vomiting, poor appetite Afebrile, no leukocytosis LFTs, lipase noted to be elevated, will trend CT abdomen pelvis showed possible mild adynamic ileus. This is suggested by moderate Stomach distension and scattered air-fluid levels within the small bowel.  There is no bowel dilation to suggest obstruction. No bowel inflammation Repeat Abd xray unremarkable USS of Abdomen: Unremarkable GI consulted: Appreciate recs, plan for CT abd/pelvis with contrast once renal fxn improves, altthough I suspect that his renal function will ever be better to attempt any contrast safely.  Appreciate GI input.  No further work-up inpatient.  Outpatient follow-up with PCP. Dietician consulted Continue Nystatin, Remeron Resume IV fluids.  Placed on scheduled MiraLAX and Metamucil.  Also on scheduled Reglan.  Monitor oral intake.  Elevated LFTs Denies any alcohol use Unknown etiology, ??hepatitis, ??underlying gallbladder dsz ??malignancy ??statins Hepatitis panel negative CT  abdomen/pelvis: No abnormality of the liver mentioned GI on board Daily CMP  AKI on CKD stage 3 Improving Unknown recent baseline, as last Cr 2.5 in 2014 On admission Cr 3.42-->2.5-->2.12-->1.88 Likely due to poor oral intake Vs obstructive uropathy UA negative Hold hydration and monitor renal function  Avoid nephrotoxics, renally dose meds Daily CMP  Pancytopenia ANC 1.0 Unknown etiology, ??malignanacy Requested peripheral blood smear review May need pan CT abd/pelvis/chest with IV once able Daily CBC, monitor closely  Acute urinary retention Noted to be retaining urine in ED S/P foley, stared voiding trial, monitor overnight  Was started on flomax during this admission, will d/c due to significant orthostatsis Monitor closely  Orthostatic positive  Still Ongoing Likely due to poor oral intake/hydration Daily orthostatics Continue IVF  Anemia of CKD Unknown baseline, no signs of bleeding FOBT negative Daily cbc  Elevated troponin/Hx of CAD Chest pain free Flat trend, EKG NSR Monitor  HTN BP soft Hold home norvasc  Gout  Continue home meds     Code Status: Full  Family Communication: None at bedside  Disposition Plan: Once work up complete   Consultants:  GI   Procedures:  None  Antimicrobials:  None   DVT prophylaxis:  SCDs   Objective: Vitals:   07/14/18 0612 07/14/18 0842 07/14/18 1051 07/14/18 1356  BP: (!) 92/58 107/70 129/79 112/72  Pulse: 98 92 85 92  Resp: (!) 21   18  Temp: 98.6 F (37 C)   98.1 F (36.7 C)  TempSrc:      SpO2: 100% 97%  100%  Weight:      Height:        Intake/Output Summary (Last 24 hours) at 07/14/2018 1749 Last data filed at 07/14/2018 1300 Gross per  24 hour  Intake 240 ml  Output -  Net 240 ml   Filed Weights   07/09/18 1223  Weight: 80.8 kg    Exam:   General: NAD, chronically ill appearing  Cardiovascular: S1, S2 present  Respiratory: CTAB   Abdomen: Soft, NT, ND, BS  present  Musculoskeletal: No pedal edema bilaterally  Skin: Normal  Psychiatry: Normal mood, denies any feeling of depression   Data Reviewed: CBC: Recent Labs  Lab 07/10/18 0519 07/11/18 0532 07/12/18 0526 07/13/18 0553 07/14/18 0606  WBC 4.4 2.3* 2.3* 2.5* 3.0*  NEUTROABS 2.9 1.1* 1.0* 1.1* 1.2*  HGB 9.9* 9.1* 9.9* 9.6* 9.4*  HCT 30.1* 27.4* 30.0* 29.5* 28.4*  MCV 82.7 82.3 82.0 83.1 83.3  PLT 125* 105* 107* 107* 096*   Basic Metabolic Panel: Recent Labs  Lab 07/09/18 2103 07/10/18 0519 07/11/18 0532 07/12/18 0526 07/13/18 0553 07/14/18 0606  NA  --  136 137 141 137 140  K  --  4.1 3.9 3.4* 3.4* 3.9  CL  --  106 109 108 108 109  CO2  --  21* 25 25 24 26   GLUCOSE  --  70 112* 107* 107* 93  BUN  --  39* 26* 16 14 20   CREATININE  --  2.50* 2.12* 1.88* 1.86* 1.94*  CALCIUM  --  8.8* 8.7* 8.7* 8.4* 8.8*  MG 2.2 2.0 1.8  --   --   --   PHOS  --  3.1  --   --   --   --    GFR: Estimated Creatinine Clearance: 30.3 mL/min (A) (by C-G formula based on SCr of 1.94 mg/dL (H)). Liver Function Tests: Recent Labs  Lab 07/10/18 0519 07/11/18 0532 07/12/18 0526 07/13/18 0553 07/14/18 0606  AST 106* 88* 92* 73* 58*  ALT 101* 90* 96* 84* 73*  ALKPHOS 82 72 80 75 73  BILITOT 1.2 0.9 0.9 0.6 0.6  PROT 6.2* 5.5* 5.8* 5.4* 5.6*  ALBUMIN 3.3* 2.9* 2.9* 2.7* 2.9*   Recent Labs  Lab 07/09/18 1238 07/11/18 0532  LIPASE 59* 40   No results for input(s): AMMONIA in the last 168 hours. Coagulation Profile: No results for input(s): INR, PROTIME in the last 168 hours. Cardiac Enzymes: Recent Labs  Lab 07/09/18 2103 07/10/18 1152 07/12/18 0526  CKTOTAL 485*  --  366  TROPONINI 0.05* 0.04*  --    BNP (last 3 results) No results for input(s): PROBNP in the last 8760 hours. HbA1C: No results for input(s): HGBA1C in the last 72 hours. CBG: No results for input(s): GLUCAP in the last 168 hours. Lipid Profile: No results for input(s): CHOL, HDL, LDLCALC, TRIG,  CHOLHDL, LDLDIRECT in the last 72 hours. Thyroid Function Tests: No results for input(s): TSH, T4TOTAL, FREET4, T3FREE, THYROIDAB in the last 72 hours. Anemia Panel: No results for input(s): VITAMINB12, FOLATE, FERRITIN, TIBC, IRON, RETICCTPCT in the last 72 hours. Urine analysis:    Component Value Date/Time   COLORURINE YELLOW 07/09/2018 1138   APPEARANCEUR CLEAR 07/09/2018 1138   LABSPEC 1.011 07/09/2018 1138   PHURINE 5.0 07/09/2018 1138   GLUCOSEU NEGATIVE 07/09/2018 1138   HGBUR MODERATE (A) 07/09/2018 1138   BILIRUBINUR NEGATIVE 07/09/2018 1138   KETONESUR 5 (A) 07/09/2018 1138   PROTEINUR NEGATIVE 07/09/2018 1138   UROBILINOGEN 0.2 02/26/2008 1900   NITRITE NEGATIVE 07/09/2018 1138   LEUKOCYTESUR NEGATIVE 07/09/2018 1138   Sepsis Labs: @LABRCNTIP (procalcitonin:4,lacticidven:4)  )No results found for this or any previous visit (from the past 240 hour(s)).  Studies: No results found.  Scheduled Meds: . brinzolamide  1 drop Both Eyes TID  . chlorhexidine  15 mL Mouth Rinse BID  . docusate sodium  100 mg Oral BID  . feeding supplement (ENSURE ENLIVE)  237 mL Oral BID BM  . latanoprost  1 drop Both Eyes QHS  . mouth rinse  15 mL Mouth Rinse q12n4p  . metoCLOPramide (REGLAN) injection  5 mg Intravenous Q8H  . mirtazapine  7.5 mg Oral QHS  . nystatin  5 mL Oral QID  . polyethylene glycol  17 g Oral Daily  . timolol  1 drop Both Eyes Daily    Continuous Infusions: . sodium chloride 75 mL/hr at 07/14/18 1228     LOS: 5 days     Berle Mull, MD [facility]   If 7PM-7AM, please contact night-coverage [email or web address] Password [facility] 07/14/2018, 5:49 PM

## 2018-07-15 LAB — CBC WITH DIFFERENTIAL/PLATELET
Basophils Absolute: 0 10*3/uL (ref 0.0–0.1)
Basophils Relative: 1 %
EOS PCT: 1 %
Eosinophils Absolute: 0 10*3/uL (ref 0.0–0.7)
HEMATOCRIT: 26.9 % — AB (ref 39.0–52.0)
HEMOGLOBIN: 8.7 g/dL — AB (ref 13.0–17.0)
LYMPHS ABS: 1.3 10*3/uL (ref 0.7–4.0)
LYMPHS PCT: 35 %
MCH: 26.9 pg (ref 26.0–34.0)
MCHC: 32.3 g/dL (ref 30.0–36.0)
MCV: 83.3 fL (ref 78.0–100.0)
Monocytes Absolute: 0.4 10*3/uL (ref 0.1–1.0)
Monocytes Relative: 10 %
NEUTROS ABS: 2 10*3/uL (ref 1.7–7.7)
NEUTROS PCT: 53 %
Platelets: 114 10*3/uL — ABNORMAL LOW (ref 150–400)
RBC: 3.23 MIL/uL — AB (ref 4.22–5.81)
RDW: 16.3 % — ABNORMAL HIGH (ref 11.5–15.5)
WBC: 3.6 10*3/uL — AB (ref 4.0–10.5)

## 2018-07-15 LAB — COMPREHENSIVE METABOLIC PANEL
ALK PHOS: 69 U/L (ref 38–126)
ALT: 76 U/L — AB (ref 0–44)
AST: 57 U/L — AB (ref 15–41)
Albumin: 2.7 g/dL — ABNORMAL LOW (ref 3.5–5.0)
Anion gap: 7 (ref 5–15)
BILIRUBIN TOTAL: 0.5 mg/dL (ref 0.3–1.2)
BUN: 24 mg/dL — AB (ref 8–23)
CALCIUM: 8.5 mg/dL — AB (ref 8.9–10.3)
CHLORIDE: 107 mmol/L (ref 98–111)
CO2: 24 mmol/L (ref 22–32)
CREATININE: 1.87 mg/dL — AB (ref 0.61–1.24)
GFR, EST AFRICAN AMERICAN: 37 mL/min — AB (ref >60)
GFR, EST NON AFRICAN AMERICAN: 32 mL/min — AB (ref >60)
Glucose, Bld: 96 mg/dL (ref 70–99)
Potassium: 4.4 mmol/L (ref 3.5–5.1)
Sodium: 138 mmol/L (ref 135–145)
Total Protein: 5.3 g/dL — ABNORMAL LOW (ref 6.5–8.1)

## 2018-07-15 MED ORDER — SENNOSIDES-DOCUSATE SODIUM 8.6-50 MG PO TABS
1.0000 | ORAL_TABLET | Freq: Two times a day (BID) | ORAL | Status: DC
  Administered 2018-07-15 – 2018-07-16 (×2): 1 via ORAL
  Filled 2018-07-15 (×2): qty 1

## 2018-07-15 MED ORDER — TRAMADOL HCL 50 MG PO TABS: 50.0000 mg | ORAL_TABLET | Freq: Four times a day (QID) | ORAL | Status: DC | PRN

## 2018-07-15 MED ORDER — METOCLOPRAMIDE HCL 5 MG/ML IJ SOLN
10.0000 mg | Freq: Four times a day (QID) | INTRAMUSCULAR | Status: DC
  Administered 2018-07-15 – 2018-07-16 (×3): 10 mg via INTRAVENOUS
  Filled 2018-07-15 (×4): qty 2

## 2018-07-15 MED ORDER — MEGESTROL ACETATE 400 MG/10ML PO SUSP
400.0000 mg | Freq: Every day | ORAL | Status: DC
  Administered 2018-07-15 – 2018-07-16 (×2): 400 mg via ORAL
  Filled 2018-07-15 (×2): qty 10

## 2018-07-15 NOTE — Care Management Important Message (Signed)
Important Message  Patient Details  Name: Matthew Winters MRN: [id number] Date of Birth: 1936/08/15   Medicare Important Message Given:  Yes    Kerin Salen 07/15/2018, 10:59 AMImportant Message  Patient Details  Name: Matthew Winters MRN: [id number] Date of Birth: May 18, 1936   Medicare Important Message Given:  Yes    Kerin Salen 07/15/2018, 10:59 AM

## 2018-07-15 NOTE — Progress Notes (Signed)
PROGRESS NOTE  Matthew Winters JEH:[id number] DOB: 15-May-1936 DOA: 07/09/2018 PCP: [name], MD  HPI/Recap of past 72 hours: 82 year old male with medical history significant for CKD, CAD, hypertension, gout, who was brought in by family complaining of vomiting and dizziness for the past 4 days.  Patient also reported poor appetite, and has not been able to keep anything down for those days.  Patient denied any abdominal pain, fever/chills, diarrhea, chest pain, dysuria.  In the ED, pt was noted to be orthostatic positive, CT head unremarkable, CT abdomen/pelvis showed adynamic ileus.  Patient was also noted to have some AKI and urinary retention, Foley was placed in the ED.  Patient admitted for further management  Today, negligible oral intake.  Just ate ice cream x2 for the whole day.  No nausea no vomiting no abdominal pain.  Had a good bowel movement.  Assessment/Plan: Unlikely adynamic ileus/Poor appetite/Oral thrush On presentation, complained of vomiting, poor appetite Afebrile, no leukocytosis LFTs, lipase noted to be elevated, will trend CT abdomen pelvis showed possible mild adynamic ileus. This is suggested by moderate Stomach distension and scattered air-fluid levels within the small bowel.  There is no bowel dilation to suggest obstruction. No bowel inflammation Repeat Abd xray unremarkable USS of Abdomen: Unremarkable GI consulted: Appreciate recs, plan for CT abd/pelvis with contrast once renal fxn improves, altthough I suspect that his renal function will ever be better to attempt any contrast safely.  Appreciate GI input.  No further work-up inpatient.  Outpatient follow-up with PCP. Dietician consulted Continue Nystatin, Remeron Upping the IV fluid, increase MiraLAX.  Increase Reglan.  Changing Remeron to Megace and monitor overnight.  Elevated LFTs Denies any alcohol use Unknown etiology, ??hepatitis, ??underlying gallbladder dsz ??malignancy ??statins Hepatitis panel  negative CT abdomen/pelvis: No abnormality of the liver mentioned GI on board Daily CMP  AKI on CKD stage 3 Improving Unknown recent baseline, as last Cr 2.5 in 2014 On admission Cr 3.42-->2.5-->2.12-->1.88 Likely due to poor oral intake Vs obstructive uropathy UA negative Hold hydration and monitor renal function  Avoid nephrotoxics, renally dose meds Daily CMP If renal function is stable will discharge home tomorrow.  Pancytopenia ANC 1.0 Unknown etiology, ??malignanacy Requested peripheral blood smear review May need pan CT abd/pelvis/chest with IV once able Daily CBC, monitor closely  Acute urinary retention Noted to be retaining urine in ED S/P foley, stared voiding trial, monitor overnight  Was started on flomax during this admission, will d/c due to significant orthostatsis Monitor closely  Orthostatic positive  Still Ongoing Likely due to poor oral intake/hydration Daily orthostatics Monitor off of IVF  Anemia of CKD Unknown baseline, no signs of bleeding FOBT negative Daily cbc  Elevated troponin/Hx of CAD Chest pain free Flat trend, EKG NSR Monitor  HTN BP soft Hold home norvasc  Gout  Continue home meds   Code Status: Full  Family Communication: None at bedside  Disposition Plan: Home tomorrow if the renal function is stable.   Consultants:  GI   Procedures:  None  Antimicrobials:  None   DVT prophylaxis:  SCDs   Objective: Vitals:   07/14/18 1356 07/14/18 1958 07/15/18 0525 07/15/18 1408  BP: 112/72 115/73 120/81 100/67  Pulse: 92 88 92 80  Resp: 18 18 (!) 22 (!) 24  Temp: 98.1 F (36.7 C) 98.7 F (37.1 C) 99 F (37.2 C) 98.2 F (36.8 C)  TempSrc:  Oral Oral Oral  SpO2: 100% 98% 97% 99%  Weight:      Height:  Intake/Output Summary (Last 24 hours) at 07/15/2018 1803 Last data filed at 07/15/2018 1408 Gross per 24 hour  Intake 1801.37 ml  Output 800 ml  Net 1001.37 ml   Filed Weights   07/09/18 1223    Weight: 80.8 kg    Exam:   General: NAD, chronically ill appearing  Cardiovascular: S1, S2 present  Respiratory: CTAB   Abdomen: Soft, NT, ND, BS present  Musculoskeletal: No pedal edema bilaterally  Skin: Normal  Psychiatry: Normal mood, denies any feeling of depression   Data Reviewed: CBC: Recent Labs  Lab 07/11/18 0532 07/12/18 0526 07/13/18 0553 07/14/18 0606 07/15/18 0548  WBC 2.3* 2.3* 2.5* 3.0* 3.6*  NEUTROABS 1.1* 1.0* 1.1* 1.2* 2.0  HGB 9.1* 9.9* 9.6* 9.4* 8.7*  HCT 27.4* 30.0* 29.5* 28.4* 26.9*  MCV 82.3 82.0 83.1 83.3 83.3  PLT 105* 107* 107* 109* 299*   Basic Metabolic Panel: Recent Labs  Lab 07/09/18 2103 07/10/18 0519 07/11/18 0532 07/12/18 0526 07/13/18 0553 07/14/18 0606 07/15/18 0548  NA  --  136 137 141 137 140 138  K  --  4.1 3.9 3.4* 3.4* 3.9 4.4  CL  --  106 109 108 108 109 107  CO2  --  21* 25 25 24 26 24   GLUCOSE  --  70 112* 107* 107* 93 96  BUN  --  39* 26* 16 14 20  24*  CREATININE  --  2.50* 2.12* 1.88* 1.86* 1.94* 1.87*  CALCIUM  --  8.8* 8.7* 8.7* 8.4* 8.8* 8.5*  MG 2.2 2.0 1.8  --   --   --   --   PHOS  --  3.1  --   --   --   --   --    GFR: Estimated Creatinine Clearance: 31.5 mL/min (A) (by C-G formula based on SCr of 1.87 mg/dL (H)). Liver Function Tests: Recent Labs  Lab 07/11/18 0532 07/12/18 0526 07/13/18 0553 07/14/18 0606 07/15/18 0548  AST 88* 92* 73* 58* 57*  ALT 90* 96* 84* 73* 76*  ALKPHOS 72 80 75 73 69  BILITOT 0.9 0.9 0.6 0.6 0.5  PROT 5.5* 5.8* 5.4* 5.6* 5.3*  ALBUMIN 2.9* 2.9* 2.7* 2.9* 2.7*   Recent Labs  Lab 07/09/18 1238 07/11/18 0532  LIPASE 59* 40   No results for input(s): AMMONIA in the last 168 hours. Coagulation Profile: No results for input(s): INR, PROTIME in the last 168 hours. Cardiac Enzymes: Recent Labs  Lab 07/09/18 2103 07/10/18 1152 07/12/18 0526  CKTOTAL 485*  --  366  TROPONINI 0.05* 0.04*  --    BNP (last 3 results) No results for input(s): PROBNP in the  last 8760 hours. HbA1C: No results for input(s): HGBA1C in the last 72 hours. CBG: No results for input(s): GLUCAP in the last 168 hours. Lipid Profile: No results for input(s): CHOL, HDL, LDLCALC, TRIG, CHOLHDL, LDLDIRECT in the last 72 hours. Thyroid Function Tests: No results for input(s): TSH, T4TOTAL, FREET4, T3FREE, THYROIDAB in the last 72 hours. Anemia Panel: No results for input(s): VITAMINB12, FOLATE, FERRITIN, TIBC, IRON, RETICCTPCT in the last 72 hours. Urine analysis:    Component Value Date/Time   COLORURINE YELLOW 07/09/2018 1138   APPEARANCEUR CLEAR 07/09/2018 1138   LABSPEC 1.011 07/09/2018 1138   PHURINE 5.0 07/09/2018 1138   GLUCOSEU NEGATIVE 07/09/2018 1138   HGBUR MODERATE (A) 07/09/2018 1138   BILIRUBINUR NEGATIVE 07/09/2018 1138   KETONESUR 5 (A) 07/09/2018 1138   PROTEINUR NEGATIVE 07/09/2018 1138   UROBILINOGEN 0.2  02/26/2008 1900   NITRITE NEGATIVE 07/09/2018 1138   LEUKOCYTESUR NEGATIVE 07/09/2018 1138   Sepsis Labs: @LABRCNTIP (procalcitonin:4,lacticidven:4)  )No results found for this or any previous visit (from the past 240 hour(s)).    Studies: No results found.  Scheduled Meds: . brinzolamide  1 drop Both Eyes TID  . chlorhexidine  15 mL Mouth Rinse BID  . feeding supplement (ENSURE ENLIVE)  237 mL Oral BID BM  . latanoprost  1 drop Both Eyes QHS  . mouth rinse  15 mL Mouth Rinse q12n4p  . megestrol  400 mg Oral Daily  . metoCLOPramide (REGLAN) injection  10 mg Intravenous Q6H  . nystatin  5 mL Oral QID  . polyethylene glycol  17 g Oral Daily  . senna-docusate  1 tablet Oral BID  . timolol  1 drop Both Eyes Daily    Continuous Infusions:    LOS: 6 days     Berle Mull, MD [facility]   If 7PM-7AM, please contact night-coverage [email or web address] Password [facility] 07/15/2018, 6:03 PM

## 2018-07-15 NOTE — Care Management Note (Signed)
Case Management Note  Patient Details  Name: Matthew Winters MRN: [id number] Date of Birth: Aug 31, 1936  Subjective/Objective:                  Nausea and vomiting/abd. Pain improving  Action/Plan: Following for progression and dc needs  Expected Discharge Date:                  Expected Discharge Plan:  Home/Self Care  In-House Referral:     Discharge planning Services  CM Consult  Post Acute Care Choice:    Choice offered to:     DME Arranged:    DME Agency:     HH Arranged:    [place] Agency:     Status of Service:  Completed, signed off  If discussed at [employer] of Stay Meetings, dates discussed:    Additional Comments:  Khyre, [place], RN 07/15/2018, 12:02 PM

## 2018-07-16 DIAGNOSIS — K560 Paralytic ileus: Secondary | ICD-10-CM

## 2018-07-16 LAB — CBC WITH DIFFERENTIAL/PLATELET
BASOS ABS: 0 10*3/uL (ref 0.0–0.1)
BASOS PCT: 0 %
Eosinophils Absolute: 0 10*3/uL (ref 0.0–0.7)
Eosinophils Relative: 1 %
HEMATOCRIT: 25 % — AB (ref 39.0–52.0)
HEMOGLOBIN: 8 g/dL — AB (ref 13.0–17.0)
LYMPHS PCT: 38 %
Lymphs Abs: 1.3 10*3/uL (ref 0.7–4.0)
MCH: 26.3 pg (ref 26.0–34.0)
MCHC: 32 g/dL (ref 30.0–36.0)
MCV: 82.2 fL (ref 78.0–100.0)
MONO ABS: 0.4 10*3/uL (ref 0.1–1.0)
Monocytes Relative: 12 %
NEUTROS ABS: 1.7 10*3/uL (ref 1.7–7.7)
Neutrophils Relative %: 49 %
Platelets: 154 10*3/uL (ref 150–400)
RBC: 3.04 MIL/uL — ABNORMAL LOW (ref 4.22–5.81)
RDW: 16.7 % — AB (ref 11.5–15.5)
WBC: 3.5 10*3/uL — ABNORMAL LOW (ref 4.0–10.5)

## 2018-07-16 LAB — COMPREHENSIVE METABOLIC PANEL
ALBUMIN: 2.7 g/dL — AB (ref 3.5–5.0)
ALT: 67 U/L — ABNORMAL HIGH (ref 0–44)
AST: 50 U/L — AB (ref 15–41)
Alkaline Phosphatase: 64 U/L (ref 38–126)
Anion gap: 3 — ABNORMAL LOW (ref 5–15)
BILIRUBIN TOTAL: 0.5 mg/dL (ref 0.3–1.2)
BUN: 27 mg/dL — AB (ref 8–23)
CO2: 25 mmol/L (ref 22–32)
CREATININE: 1.82 mg/dL — AB (ref 0.61–1.24)
Calcium: 8.4 mg/dL — ABNORMAL LOW (ref 8.9–10.3)
Chloride: 108 mmol/L (ref 98–111)
GFR calc Af Amer: 39 mL/min — ABNORMAL LOW (ref >60)
GFR, EST NON AFRICAN AMERICAN: 33 mL/min — AB (ref >60)
GLUCOSE: 97 mg/dL (ref 70–99)
POTASSIUM: 4.2 mmol/L (ref 3.5–5.1)
Sodium: 136 mmol/L (ref 135–145)
TOTAL PROTEIN: 5.3 g/dL — AB (ref 6.5–8.1)

## 2018-07-16 LAB — MAGNESIUM: MAGNESIUM: 2.1 mg/dL (ref 1.7–2.4)

## 2018-07-16 MED ORDER — ONDANSETRON HCL 4 MG PO TABS: 4.0000 mg | ORAL_TABLET | Freq: Four times a day (QID) | ORAL | 0 refills | Status: AC | PRN

## 2018-07-16 MED ORDER — ENSURE ENLIVE PO LIQD: 237.0000 mL | Freq: Three times a day (TID) | ORAL | 0 refills | Status: AC

## 2018-07-16 MED ORDER — NYSTATIN 100000 UNIT/ML MT SUSP: 5.0000 mL | Freq: Four times a day (QID) | OROMUCOSAL | 0 refills | Status: AC

## 2018-07-16 MED ORDER — METOCLOPRAMIDE HCL 10 MG PO TABS: 10.0000 mg | ORAL_TABLET | Freq: Three times a day (TID) | ORAL | 0 refills | Status: AC

## 2018-07-16 MED ORDER — MEGESTROL ACETATE 400 MG/10ML PO SUSP: 400.0000 mg | Freq: Every day | ORAL | 0 refills | Status: AC

## 2018-07-16 MED ORDER — POLYETHYLENE GLYCOL 3350 17 G PO PACK: 17.0000 g | PACK | Freq: Every day | ORAL | 0 refills | Status: AC

## 2018-07-16 MED ORDER — DOCUSATE SODIUM 100 MG PO CAPS: 100.0000 mg | ORAL_CAPSULE | Freq: Two times a day (BID) | ORAL | 0 refills | Status: AC

## 2018-07-16 NOTE — Progress Notes (Signed)
Discharge instructions and medications discussed with patient.  AVS given to patient.  All questions answered.  

## 2018-07-17 NOTE — Care Management Note (Signed)
Case Management Note  Patient Details  Name: Matthew Winters MRN: [id number] Date of Birth: January 07, 1936  Subjective/Objective:   Ileus, poor appetite, elevated LFT, AKI                 Action/Plan: NCM spoke to pt and offered choice for [facility]. Pt agreeable to [facility] for HH. [facility] delivered RW to room prior to dc. Contacted [facility] for [facility].   Expected Discharge Date:  07/16/18               Expected Discharge Plan:  [place]  In-House Referral:  NA  Discharge planning Services  CM Consult  Post Acute Care Choice:  Home Health Choice offered to:  Patient  DME Arranged:  [place] [facility] D[facility] Agency:  [place] Arranged:  PT, RN [facility] Agency:  [place]  Status of Service:  Completed, signed off  If discussed at [place] of Stay Meetings, dates discussed:    Additional Comments:  Erenest Rasher, RN 07/17/2018, 3:24 PM

## 2018-07-19 NOTE — Discharge Summary (Signed)
[facility] Discharge Summary   Patient: Matthew Winters SFK:[id number]   PCP: [name], MD DOB: 1936-06-20   Date of admission: 07/09/2018   Date of discharge: 07/16/2018    Discharge Diagnoses:  Active Problems:   AKI (acute kidney injury) ([place])   Dehydration   Adynamic ileus (HCC)   Essential hypertension   CKD (chronic kidney disease), stage III ([place])   Acute urinary retention   Abnormal liver enzymes   Loss of appetite   Admitted From: home Disposition:  home  Recommendations for Outpatient Follow-up:  1. Please follow up with PCP in 1 week  2. Per gastroenterology need CT abd/pelvis/ with IV once able vs MRI abdomen  3. Also may need an outpatient hematology referral   Follow-up Information    [name], MD. Schedule an appointment as soon as possible for a visit in 1 week(s).   Specialty:  Internal Medicine Why:  follow up on CBC may need hematology referral.  also per GI may need repeat CT abdomen or MRI abdomen w/o contrast for further work up of weightloss.  Contact information: [place] [id number] [phone]        [facility]-Home Follow up.   Specialty:  Home Health Services Why:  Home Health Physical Therapy, RN - agency will call to arrange appointment Contact information: [address] [place] [address] [phone]          Diet recommendation: regular diet  Activity: The patient is advised to gradually reintroduce usual activities.  Discharge Condition: good  Code Status: full code  History of present illness: As per the H and P dictated on admission, "Matthew Winters is a 82 y.o. male with medical history significant of CKD, CAD, HLD Remote history of GI bleed, glaucoma, gout, HTN  Presented with vomiting and lightheadedness for the past 4 days no abdominal pain no chest pain no diarrhea no fevers no dysuria. Family states he is unable to keep anything down and started to feel  lightlheaded.  Patient himself unable to provide detailed history describe with his dizziness feels like Reports 2-3 days ago his BM was black but unsure about details"  Hospital Course:  Summary of his active problems in the hospital is as following. adynamic ileus/Poor appetite/Oral thrush On presentation, complained of vomiting, poor appetite Afebrile, no leukocytosis LFTs, lipase noted to be elevated, will trend CT abdomen pelvis showed possible mild adynamic ileus. This is suggested by moderate Stomach distension and scattered air-fluid levels within the small bowel.  There is no bowel dilation to suggest obstruction. No bowel inflammation Repeat Abd xray unremarkable USS of Abdomen: Unremarkable GI consulted: Appreciate recs, plan for CT abd/pelvis with contrast once renal fxn improves, altthough I suspect that his renal function will ever be better to attempt any contrast safely.   No further work-up inpatient.  Outpatient follow-up with PCP. Dietician consulted Continue Nystatin,  MiraLAX.  Reglan.  Changing Remeron to Megace   Elevated LFTs Denies any alcohol use Unknown etiology, ??hepatitis, ??underlying gallbladder dsz ??malignancy ??statins Hepatitis panel negative CT abdomen/pelvis: No abnormality of the liver mentioned GI on board Daily CMP  AKI on CKD stage 3 Improving Unknown recent baseline, as last Cr 2.5 in 2014 On admission Cr 3.42-->2.5-->2.12-->1.88 Likely due to poor oral intake Vs obstructive uropathy UA negative Hold hydration and monitor renal function  Avoid nephrotoxics, renally dose meds Daily CMP If renal function is stable will discharge home tomorrow.  Pancytopenia Unknown etiology,  May  need pan CT abd/pelvis/chest with IV once able vs MRI abdomen  Also may need an outpatient hematology referral   Acute urinary retention Noted to be retaining urine in ED S/P foley, stared voiding trial, tolerated well Was started on flomax during  this admission, was d/c due to significant orthostatsis  Orthostatic positive  Resolved  Likely due to poor oral intake/hydration  Anemia of CKD Unknown baseline, no signs of bleeding FOBT negative  Elevated troponin/Hx of CAD Chest pain free Flat trend, EKG NSR  HTN BP soft Hold home norvasc  Gout  Continue home meds  All other chronic medical condition were stable during the hospitalization.  Patient was seen by physical therapy, who recommended home health, which was arranged by Education officer, museum and case Freight forwarder. On the day of the discharge the patient's vitals were stable , and no other acute medical condition were reported by patient. the patient was felt safe to be discharge at home with home health.  Consultants: gastroenterology  Procedures: none  DISCHARGE MEDICATION: Allergies as of 07/16/2018   No Known Allergies     Medication List    STOP taking these medications   allopurinol 100 MG tablet Commonly known as:  ZYLOPRIM   amLODipine 5 MG tablet Commonly known as:  NORVASC   atorvastatin 10 MG tablet Commonly known as:  LIPITOR     TAKE these medications   AZOPT 1 % ophthalmic suspension Generic drug:  brinzolamide Place 1 drop into both eyes 3 (three) times daily.   bimatoprost 0.03 % ophthalmic solution Commonly known as:  LUMIGAN Place 1 drop into both eyes at bedtime.   colchicine 0.6 MG tablet Take 0.6 mg by mouth daily as needed (gout flares).   docusate sodium 100 MG capsule Commonly known as:  COLACE Take 1 capsule (100 mg total) by mouth 2 (two) times daily for 5 days.   feeding supplement (ENSURE ENLIVE) Liqd Take 237 mLs by mouth 3 (three) times daily between meals.   megestrol 400 MG/10ML suspension Commonly known as:  MEGACE Take 10 mLs (400 mg total) by mouth daily.   metoCLOPramide 10 MG tablet Commonly known as:  REGLAN Take 1 tablet (10 mg total) by mouth 3 (three) times daily before meals for 4 days.   nystatin  100000 UNIT/ML suspension Commonly known as:  MYCOSTATIN Take 5 mLs (500,000 Units total) by mouth 4 (four) times daily for 6 days.   ondansetron 4 MG tablet Commonly known as:  ZOFRAN Take 1 tablet (4 mg total) by mouth every 6 (six) hours as needed for nausea. Do Not take while taking metoclopramide. Start taking on:  07/20/2018   polyethylene glycol packet Commonly known as:  MIRALAX / GLYCOLAX Take 17 g by mouth daily.   timolol 0.5 % ophthalmic solution Commonly known as:  TIMOPTIC Place 1 drop into both eyes daily.      No Known Allergies Discharge Instructions    Diet - low sodium heart healthy   Complete by:  As directed    Increase activity slowly   Complete by:  As directed      Discharge Exam: Filed Weights   07/09/18 1223  Weight: 80.8 kg   Vitals:   07/15/18 2022 07/16/18 0646  BP: (!) 94/57 105/64  Pulse: 96 82  Resp: (!) 21 (!) 21  Temp: 98.7 F (37.1 C)   SpO2: 95% 96%   General: Appear in no distress, no Rash; Oral Mucosa moist. Cardiovascular: S1 and S2 Present, no Murmur,  no JVD Respiratory: Bilateral Air entry present and Clear to Auscultation, no Crackles, no wheezes Abdomen: Bowel Sound present, Soft and no tenderness Extremities: no Pedal edema, no calf tenderness Neurology: Grossly no focal neuro deficit.  The results of significant diagnostics from this hospitalization (including imaging, microbiology, ancillary and laboratory) are listed below for reference.    Significant Diagnostic Studies: Ct Abdomen Pelvis Wo Contrast  Result Date: 07/09/2018 CLINICAL DATA:  Patient [name], c/o N/V x4 days.Denies abdominal pain, chest pain, SOB, and diarrhea EXAM: CT ABDOMEN AND PELVIS WITHOUT CONTRAST TECHNIQUE: Multidetector CT imaging of the abdomen and pelvis was performed following the standard protocol without IV contrast. COMPARISON:  None. FINDINGS: Lower chest: No acute abnormality. Hepatobiliary: Choose 1 Pancreas: Choose 1 Spleen: Choose  1 Adrenals/Urinary Tract: No adrenal masses. Bilateral low-density renal masses consistent with cysts, largest from the upper pole of the left kidney measuring 8.7 cm. Bilateral renal cortical thinning. Small calcification in the upper pole of the right kidney likely lies on the wall of the upper pole cyst. No convincing collecting system calcifications. No hydronephrosis. Ureters are normal in course and caliber. Bladder is unremarkable. Stomach/Bowel: Stomach is moderately distended. No stomach wall thickening or adjacent inflammation. Small bowel is normal in caliber. There are small bowel air-fluid levels. There is no wall thickening or adjacent inflammation. Colon is normal in caliber. No colonic wall thickening or inflammation. Normal appendix visualized. Vascular/Lymphatic: Aortic atherosclerosis. No enlarged abdominal or pelvic lymph nodes. Reproductive: Prostate enlarged measuring 5.5 x 4.1 cm transversely. Other: No abdominal wall hernia or abnormality. No abdominopelvic ascites. Musculoskeletal: No fracture or acute finding. No osteoblastic or osteolytic lesions. IMPRESSION: 1. Possible mild adynamic ileus. This is suggested by moderate stomach distension and scattered air-fluid levels within the small bowel. There is no bowel dilation to suggest obstruction. No bowel inflammation. 2. No other evidence of an acute abnormality within the abdomen or pelvis. 3. Multiple renal cysts with renal cortical thinning. 4. Aortic atherosclerosis. Electronically Signed   By: Lajean Manes M.D.   On: 07/09/2018 16:00   Ct Head Wo Contrast  Result Date: 07/09/2018 CLINICAL DATA:  Dizziness. EXAM: CT HEAD WITHOUT CONTRAST TECHNIQUE: Contiguous axial images were obtained from the base of the skull through the vertex without intravenous contrast. COMPARISON:  None. FINDINGS: Brain: No evidence of acute infarction, hemorrhage, hydrocephalus, extra-axial collection or mass lesion/mass effect. There is age appropriate  volume loss. Mild white matter hypoattenuation is noted consistent with chronic microvascular ischemic change. Vascular: No hyperdense vessel or unexpected calcification. Skull: Normal. Negative for fracture or focal lesion. Sinuses/Orbits: Globes and orbits are unremarkable. Visualized sinuses are clear. Other: None. IMPRESSION: 1. No acute intracranial abnormalities. 2. Age-appropriate volume loss. Mild chronic microvascular ischemic change. Electronically Signed   By: Lajean Manes M.D.   On: 07/09/2018 15:52   US Abdomen Complete  Result Date: 07/11/2018 CLINICAL DATA:  Elevated LFT with nausea EXAM: ABDOMEN ULTRASOUND COMPLETE COMPARISON:  Radiograph 07/10/2018, CT 07/09/2018 FINDINGS: Gallbladder: Moderate sludge in the gallbladder without shadowing stone. Negative sonographic Murphy. Wall thickness within normal limits at 3 mm. Small amount of fluid near the gallbladder. Common bile duct: Diameter: 5 mm Liver: No focal lesion identified. Within normal limits in parenchymal echogenicity. Portal vein is patent on color Doppler imaging with normal direction of blood flow towards the liver. IVC: No abnormality visualized. Pancreas: Not seen due to bowel gas. Spleen: Size and appearance within normal limits. Right Kidney: Length: 10.8 cm. No hydronephrosis. Multiple cysts. Largest measured  cyst is seen within the mid to upper pole and measures 2.8 x 2.7 x 2.7 cm Left Kidney: Length: 11.8 cm. No hydronephrosis. Multiple cysts. Largest measured cyst in the upper pole measures 8.9 x 7.4 x 7.2 cm. Abdominal aorta: Maximum AP diameter proximally of 2.9 cm. Aortic atherosclerosis. Other findings: Small amount of peri hepatic ascites. IMPRESSION: 1. No focal hepatic abnormality. Negative for biliary dilatation by sonography. 2. Gallbladder sludge without wall thickening or sonographic Murphy. 3. Small amount of fluid near the gallbladder, suspect that this is related to generalized small amount of perihepatic  ascites. 4. Multiple cysts within the kidneys. Electronically Signed   By: Donavan Foil M.D.   On: 07/11/2018 17:32   Dg Chest Port 1 View  Result Date: 07/12/2018 CLINICAL DATA:  Initial evaluation for pancytopenia. EXAM: PORTABLE CHEST 1 VIEW COMPARISON:  Prior radiograph from 07/20/2012. FINDINGS: Mild cardiomegaly, stable. Mediastinal silhouette within normal limits. Aortic atherosclerosis. Lungs hypoinflated. Secondary mild bibasilar subsegmental atelectatic changes. No focal infiltrates or consolidative airspace disease. No pulmonary edema or pleural effusion. IMPRESSION: 1. Shallow lung inflation with secondary mild bibasilar subsegmental atelectasis. 2. No other active cardiopulmonary disease. 3. Aortic atherosclerosis. Electronically Signed   By: Jeannine Boga M.D.   On: 07/12/2018 15:51   Dg Abd Portable 2v  Result Date: 07/10/2018 CLINICAL DATA:  Order requisition states vomiting, but pt states he does not have any abdominal complaints. Reports 2-3 days ago his BM was black but unsure about details. EXAM: PORTABLE ABDOMEN - 2 VIEW COMPARISON:  CT, 07/09/2018 FINDINGS: There is no bowel dilation to suggest obstruction. Residual contrast is noted in the colon. There are few colonic air-fluid levels on the decubitus view. No free air. IMPRESSION: 1. No evidence of bowel obstruction or free air. 2. Few colonic air-fluid levels, nonspecific. No findings of a significant adynamic ileus. Electronically Signed   By: Lajean Manes M.D.   On: 07/10/2018 11:44    Microbiology: No results found for this or any previous visit (from the past 240 hour(s)).   Labs: CBC: Recent Labs  Lab 07/13/18 0553 07/14/18 0606 07/15/18 0548 07/16/18 0528  WBC 2.5* 3.0* 3.6* 3.5*  NEUTROABS 1.1* 1.2* 2.0 1.7  HGB 9.6* 9.4* 8.7* 8.0*  HCT 29.5* 28.4* 26.9* 25.0*  MCV 83.1 83.3 83.3 82.2  PLT 107* 109* 114* 563   Basic Metabolic Panel: Recent Labs  Lab 07/13/18 0553 07/14/18 0606 07/15/18 0548  07/16/18 0528  NA 137 140 138 136  K 3.4* 3.9 4.4 4.2  CL 108 109 107 108  CO2 24 26 24 25   GLUCOSE 107* 93 96 97  BUN 14 20 24* 27*  CREATININE 1.86* 1.94* 1.87* 1.82*  CALCIUM 8.4* 8.8* 8.5* 8.4*  MG  --   --   --  2.1   Liver Function Tests: Recent Labs  Lab 07/13/18 0553 07/14/18 0606 07/15/18 0548 07/16/18 0528  AST 73* 58* 57* 50*  ALT 84* 73* 76* 67*  ALKPHOS 75 73 69 64  BILITOT 0.6 0.6 0.5 0.5  PROT 5.4* 5.6* 5.3* 5.3*  ALBUMIN 2.7* 2.9* 2.7* 2.7*   Time spent: 35 minutes  Signed:  Berle Mull  [facility] 07/16/2018 , 6:24 PM

## 2018-09-18 ENCOUNTER — Other Ambulatory Visit: Payer: Self-pay

## 2018-09-18 ENCOUNTER — Emergency Department (HOSPITAL_COMMUNITY)
Admission: EM | Admit: 2018-09-18 | Discharge: 2018-09-18 | Disposition: A | Discharge status: Home / Self Care | Payer: Medicare Other | Attending: Emergency Medicine | Admitting: Emergency Medicine

## 2018-09-18 ENCOUNTER — Encounter (HOSPITAL_COMMUNITY): Payer: Self-pay

## 2018-09-18 ENCOUNTER — Emergency Department (HOSPITAL_COMMUNITY): Payer: Medicare Other

## 2018-09-18 DIAGNOSIS — M25519 Pain in unspecified shoulder: Secondary | ICD-10-CM | POA: Diagnosis present

## 2018-09-18 DIAGNOSIS — I129 Hypertensive chronic kidney disease with stage 1 through stage 4 chronic kidney disease, or unspecified chronic kidney disease: Secondary | ICD-10-CM | POA: Diagnosis not present

## 2018-09-18 DIAGNOSIS — Z79899 Other long term (current) drug therapy: Secondary | ICD-10-CM | POA: Insufficient documentation

## 2018-09-18 DIAGNOSIS — Z87891 Personal history of nicotine dependence: Secondary | ICD-10-CM | POA: Diagnosis not present

## 2018-09-18 DIAGNOSIS — N183 Chronic kidney disease, stage 3 (moderate): Secondary | ICD-10-CM | POA: Insufficient documentation

## 2018-09-18 LAB — CBC WITH DIFFERENTIAL/PLATELET
Abs Immature Granulocytes: 0 10*3/uL (ref 0.00–0.07)
BASOS ABS: 0 10*3/uL (ref 0.0–0.1)
BASOS PCT: 1 %
EOS ABS: 0 10*3/uL (ref 0.0–0.5)
Eosinophils Relative: 0 %
HCT: 29.9 % — ABNORMAL LOW (ref 39.0–52.0)
Hemoglobin: 9.1 g/dL — ABNORMAL LOW (ref 13.0–17.0)
Immature Granulocytes: 0 %
Lymphocytes Relative: 38 %
Lymphs Abs: 0.9 10*3/uL (ref 0.7–4.0)
MCH: 27.2 pg (ref 26.0–34.0)
MCHC: 30.4 g/dL (ref 30.0–36.0)
MCV: 89.3 fL (ref 80.0–100.0)
MONOS PCT: 16 %
Monocytes Absolute: 0.4 10*3/uL (ref 0.1–1.0)
NEUTROS PCT: 45 %
NRBC: 0 % (ref 0.0–0.2)
Neutro Abs: 1.1 10*3/uL — ABNORMAL LOW (ref 1.7–7.7)
PLATELETS: 236 10*3/uL (ref 150–400)
RBC: 3.35 MIL/uL — AB (ref 4.22–5.81)
RDW: 16.2 % — AB (ref 11.5–15.5)
WBC: 2.4 10*3/uL — AB (ref 4.0–10.5)

## 2018-09-18 LAB — BASIC METABOLIC PANEL
ANION GAP: 6 (ref 5–15)
BUN: 25 mg/dL — ABNORMAL HIGH (ref 8–23)
CALCIUM: 9.1 mg/dL (ref 8.9–10.3)
CO2: 24 mmol/L (ref 22–32)
Chloride: 110 mmol/L (ref 98–111)
Creatinine, Ser: 2.63 mg/dL — ABNORMAL HIGH (ref 0.61–1.24)
GFR calc Af Amer: 25 mL/min — ABNORMAL LOW (ref >60)
GFR, EST NON AFRICAN AMERICAN: 21 mL/min — AB (ref >60)
GLUCOSE: 96 mg/dL (ref 70–99)
Potassium: 4.2 mmol/L (ref 3.5–5.1)
SODIUM: 140 mmol/L (ref 135–145)

## 2018-09-18 LAB — I-STAT TROPONIN, ED: Troponin i, poc: 0.01 ng/mL (ref 0.00–0.08)

## 2018-09-18 NOTE — ED Notes (Signed)
ED Provider at bedside. 

## 2018-09-18 NOTE — ED Triage Notes (Signed)
Pt is AOx4 and ambulatory. Pt arrived via POV. Pt chief complaint is left and right shoulders and arms. Pt is AOx4.  ED Provider is currently at bedside.

## 2018-09-18 NOTE — ED Provider Notes (Signed)
[facility] DEPT Provider Note   CSN: [id number] Arrival date & time: 09/18/18  0945     History   Chief Complaint Chief Complaint  Patient presents with  . Shoulder Pain    HPI Matthew Winters is a 82 y.o. male.  Patient with history of arthritis who presents to the ED with vague complaints.  Patient states mostly upper shoulder pain and aches.  Denies any chest pain, shortness of breath, headache.  Patient denies any abdominal pain.  Patient is here with his sister.  He states that his brother died last week and that he has been depressed about that.  Has had some difficulty sleeping.  Does have some support at home as he does live with his daughter.  Overall he feels well.  He has some pain when he moves his shoulders.  Denies any trauma.  The history is provided by the patient.  Illness  This is a new problem. The current episode started 2 days ago. The problem occurs constantly. The problem has not changed since onset.Pertinent negatives include no chest pain, no abdominal pain, no headaches and no shortness of breath. Nothing aggravates the symptoms. Nothing relieves the symptoms. He has tried nothing for the symptoms. The treatment provided no relief.    Past Medical History:  Diagnosis Date  . Arthritis   . Cataract   . Glaucoma   . Gout   . History of blood transfusion   . Hyperlipidemia   . Hypertension     Patient Active Problem List   Diagnosis Date Noted  . Abnormal liver enzymes   . Loss of appetite   . AKI (acute kidney injury) ([place]) 07/09/2018  . Dehydration 07/09/2018  . Adynamic ileus ([place]) 07/09/2018  . Essential hypertension 07/09/2018  . CKD (chronic kidney disease), stage III ([place]) 07/09/2018  . Acute urinary retention 07/09/2018    Past Surgical History:  Procedure Laterality Date  . CATARACT EXTRACTION W/PHACO  07/27/2012   Procedure: CATARACT EXTRACTION PHACO AND INTRAOCULAR LENS PLACEMENT (IOC);  Surgeon: Marylynn Pearson, MD;  Location: [place];  Service: Ophthalmology;  Laterality: Right;  . COLONOSCOPY    . COLONOSCOPY W/ POLYPECTOMY    . EYE SURGERY  2012   glaucoma surgery - right  . MULTIPLE EXTRACTIONS WITH ALVEOLOPLASTY N/A 10/16/2013   Procedure: MULTIPLE EXTRACION WITH ALVEOLOPLASTY;  Surgeon: Gae Bon, DDS;  Location: [place];  Service: Oral Surgery;  Laterality: N/A;  . POLYPECTOMY          Home Medications    Prior to Admission medications   Medication Sig Start Date End Date Taking? Authorizing Provider  AZOPT 1 % ophthalmic suspension Place 1 drop into both eyes 3 (three) times daily. 07/01/18   [provider]  bimatoprost (LUMIGAN) 0.03 % ophthalmic solution Place 1 drop into both eyes at bedtime.    [provider]  colchicine 0.6 MG tablet Take 0.6 mg by mouth daily as needed (gout flares).     [provider]  feeding supplement, ENSURE ENLIVE, (ENSURE ENLIVE) LIQD Take 237 mLs by mouth 3 (three) times daily between meals. 07/16/18   Lavina Hamman, MD  megestrol (MEGACE) 400 MG/10ML suspension Take 10 mLs (400 mg total) by mouth daily. 07/17/18   Lavina Hamman, MD  metoCLOPramide (REGLAN) 10 MG tablet Take 1 tablet (10 mg total) by mouth 3 (three) times daily before meals for 4 days. 07/16/18 07/20/18  Lavina Hamman, MD  ondansetron (  ZOFRAN) 4 MG tablet Take 1 tablet (4 mg total) by mouth every 6 (six) hours as needed for nausea. Do Not take while taking metoclopramide. 07/20/18   Lavina Hamman, MD  polyethylene glycol [facility] / Floria Raveling) packet Take 17 g by mouth daily. 07/17/18   Lavina Hamman, MD  timolol (TIMOPTIC) 0.5 % ophthalmic solution Place 1 drop into both eyes daily. 07/01/18   [provider]    Family History Family History  Problem Relation Age of Onset  . Heart disease Mother   . Colon cancer Neg Hx   . Colon polyps Neg Hx   . Rectal cancer Neg Hx   . Stomach cancer Neg Hx     Social  History Social History   Tobacco Use  . Smoking status: Former Research scientist (life sciences)  . Smokeless tobacco: Never Used  Substance Use Topics  . Alcohol use: No    Alcohol/week: 0.0 standard drinks  . Drug use: No     Allergies   Patient has no known allergies.   Review of Systems Review of Systems  Constitutional: Negative for chills and fever.  HENT: Negative for ear pain and sore throat.   Eyes: Negative for pain and visual disturbance.  Respiratory: Negative for cough and shortness of breath.   Cardiovascular: Negative for chest pain and palpitations.  Gastrointestinal: Negative for abdominal pain and vomiting.  Genitourinary: Negative for dysuria and hematuria.  Musculoskeletal: Positive for arthralgias. Negative for back pain.  Skin: Negative for color change and rash.  Neurological: Negative for seizures, syncope and headaches.  All other systems reviewed and are negative.    Physical Exam Updated Vital Signs BP (!) 156/96 (BP Location: Left Arm)   Pulse 77   Temp 97.9 F (36.6 C) (Oral)   Resp 17   Ht 5\' 6"  (1.676 m)   Wt 80 kg   SpO2 98%   BMI 28.47 kg/m   Physical Exam  Constitutional: He is oriented to person, place, and time. He appears well-developed and well-nourished.  HENT:  Head: Normocephalic and atraumatic.  Eyes: Pupils are equal, round, and reactive to light. Conjunctivae and EOM are normal.  Neck: Normal range of motion. Neck supple.  Cardiovascular: Normal rate, regular rhythm, normal heart sounds and intact distal pulses.  No murmur heard. Pulmonary/Chest: Effort normal and breath sounds normal. No respiratory distress.  Abdominal: Soft. Bowel sounds are normal. He exhibits no distension. There is no tenderness.  Musculoskeletal: He exhibits tenderness (TTP to b/l trapezius muscles). He exhibits no edema.  Neurological: He is alert and oriented to person, place, and time. No cranial nerve deficit or sensory deficit. He exhibits normal muscle tone.  Coordination normal.  Normal gait, normal finger to nose finger, no drift, 5+/5 strength, normal sensation   Skin: Skin is warm and dry.  Psychiatric: He has a normal mood and affect.  Nursing note and vitals reviewed.    ED Treatments / Results  Labs (all labs ordered are listed, but only abnormal results are displayed) Labs Reviewed  CBC WITH DIFFERENTIAL/PLATELET - Abnormal; Notable for the following components:      Result Value   WBC 2.4 (*)    RBC 3.35 (*)    Hemoglobin 9.1 (*)    HCT 29.9 (*)    RDW 16.2 (*)    Neutro Abs 1.1 (*)    All other components within normal limits  BASIC METABOLIC PANEL - Abnormal; Notable for the following components:   BUN 25 (*)  Creatinine, Ser 2.63 (*)    GFR calc non Af Amer 21 (*)    GFR calc Af Amer 25 (*)    All other components within normal limits  I-STAT TROPONIN, ED    EKG EKG Interpretation  Date/Time:  Sunday September 18 2018 10:49:06 EDT Ventricular Rate:  64 PR Interval:    QRS Duration: 105 QT Interval:  431 QTC Calculation: 445 R Axis:   17 Text Interpretation:  Sinus rhythm Confirmed by Lennice Sites [phone]) on 09/18/2018 10:51:12 AM   Radiology No results found.  Procedures Procedures (including critical care time)  Medications Ordered in ED Medications - No data to display   Initial Impression / Assessment and Plan / ED Course  I have reviewed the triage vital signs and the nursing notes.  Pertinent labs & imaging results that were available during my care of the patient were reviewed by me and considered in my medical decision making (see chart for details).     Matthew Winters is an 82 year old male history of hypertension, high cholesterol, arthritis who presents to the ED with bilateral upper shoulder aches.  Patient with normal vitals.  No fever.  Patient with symptoms for the last several days.  No chest pain, no shortness of breath.  No abdominal pain.  Patient states some pain when he shrugs his  shoulders.  Has a history of arthritis.  Denies any trauma.  No obvious repetitive use.  Patient denies headache, nausea, vomiting.  Exam is overall unremarkable.  Neurological exam was normal.  Patient appears a little bit withdrawn on exam and after further investigation he appears that he has some depression about the recent death of his brother.  Patient is here with his sister who states that patient has been very emotional about the death of his younger brother.  Patient lives at home with his daughter.  Suspect that generally patient has some depression that is likely causing his issues today.  He denies any suicidal homicidal ideation.  Patient with normal vitals.  EKG showed sinus rhythm.  Troponin normal.  No significant anemia, lateral abnormality, kidney injury. Hb, Cr, WBC all around baseline. Chest x-ray showed no signs of pneumonia, no pneumothorax, no pleural effusion.  Patient has a little bit of tenderness in his shoulders and a little bit of pain in his shoulders when he moves them.  Possibly some mild arthritis.  However suspect likely issue secondary to depression.  Sister states that she will be with the patient and patient was given reassurance.  Discharged from ED in good condition.  Do not suspect any pulmonary cardiac process at this time.  Given return precautions.  Recommend follow-up with primary care doctor as well.  This chart was dictated using voice recognition software.  Despite best efforts to proofread,  errors can occur which can change the documentation meaning.   Final Clinical Impressions(s) / ED Diagnoses   Final diagnoses:  Shoulder pain, unspecified chronicity, unspecified laterality    ED Discharge Orders    None       Lennice Sites, DO 09/18/18 1130

## 2018-09-18 NOTE — ED Notes (Signed)
Patients younger brother passed away 1 week ago from today.

## 2019-03-13 IMAGING — US US ABDOMEN COMPLETE
1 series · 13 of 25 positions shown · non-contrast
Comparison: Radiograph 07/10/2018, CT 07/09/2018

CLINICAL DATA: Elevated LFT with nausea

EXAM:
ABDOMEN ULTRASOUND COMPLETE

[Series 1: us abdomen complete · 0.15mm/px · 13 of 80 slices shown]
[im 1/80]
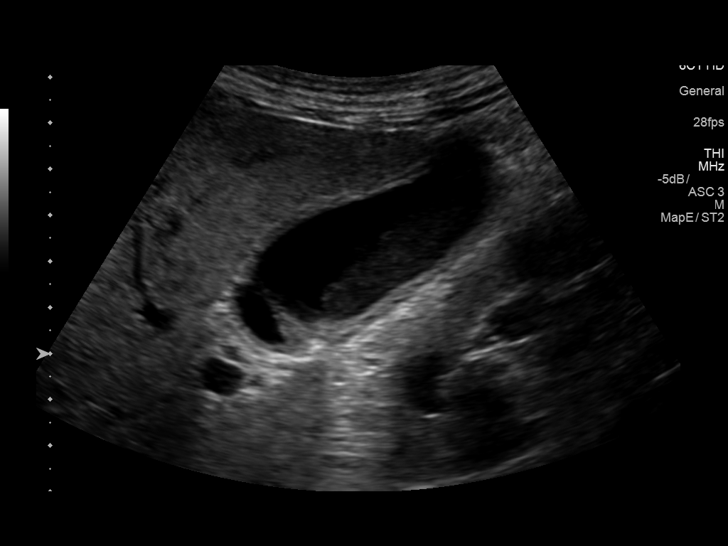
[im 7/80]
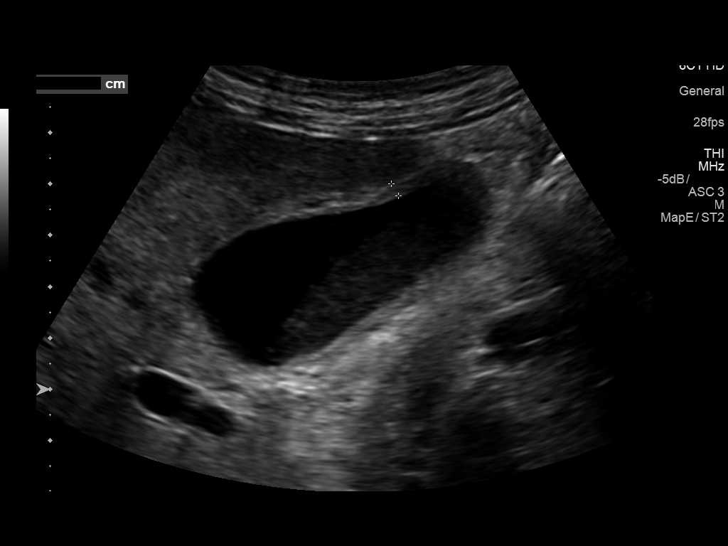
[im 14/80]
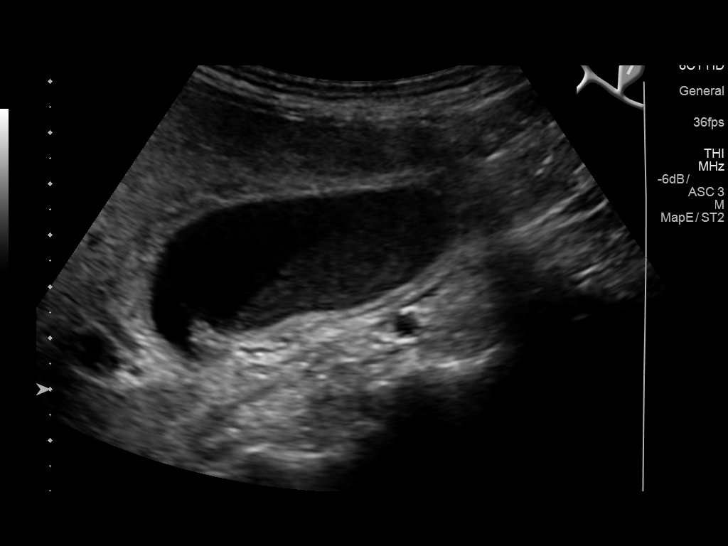
[im 20/80]
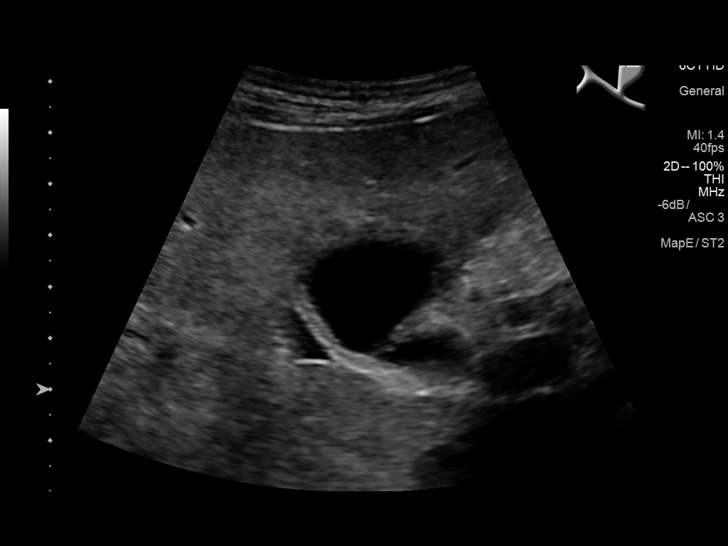
[im 27/80]
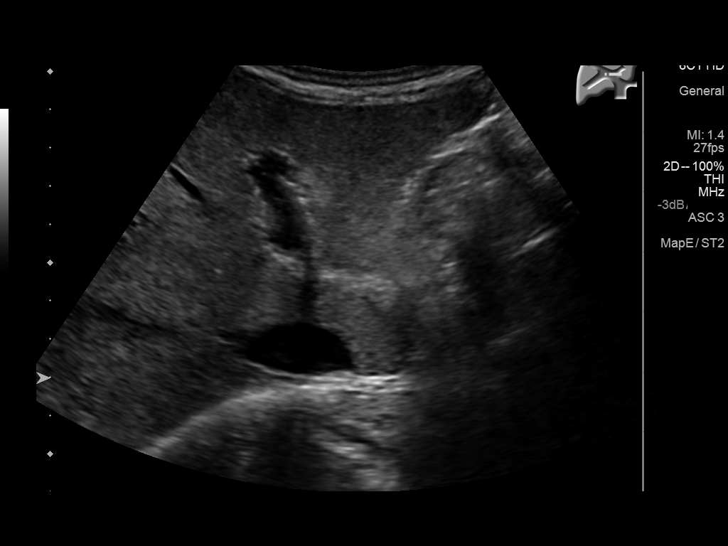
[im 33/80]
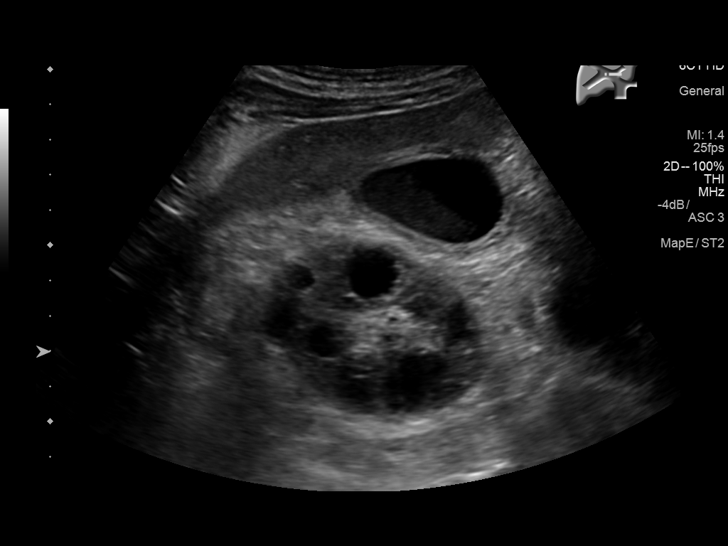
[im 40/80]
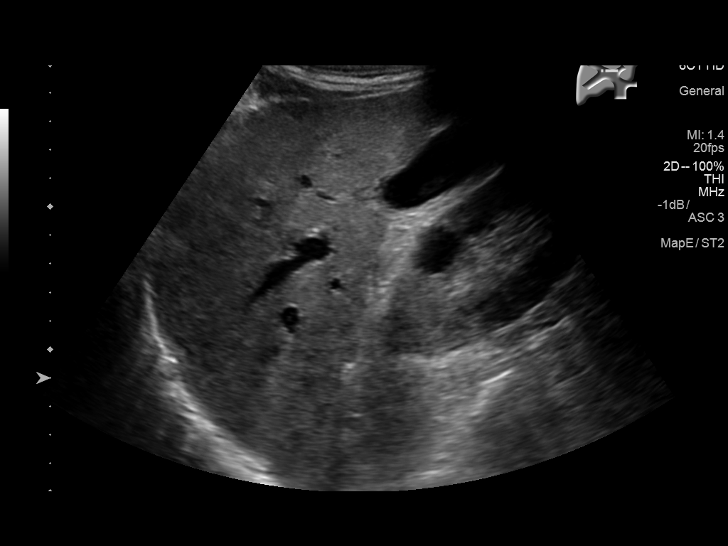
[im 47/80]
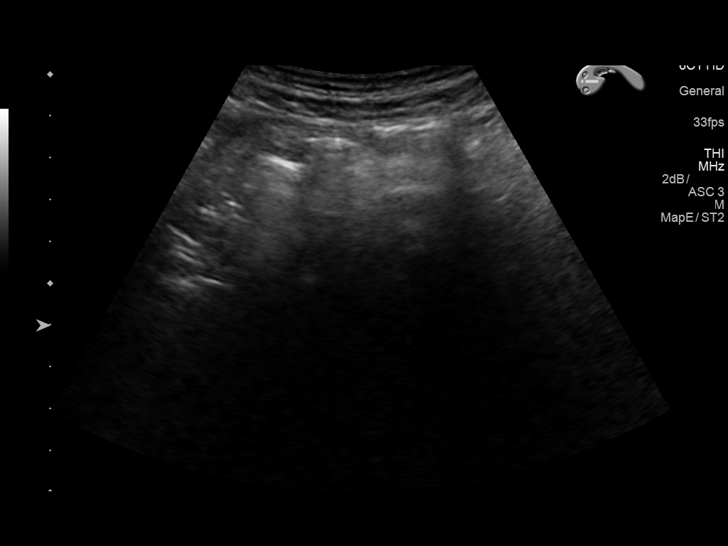
[im 53/80]
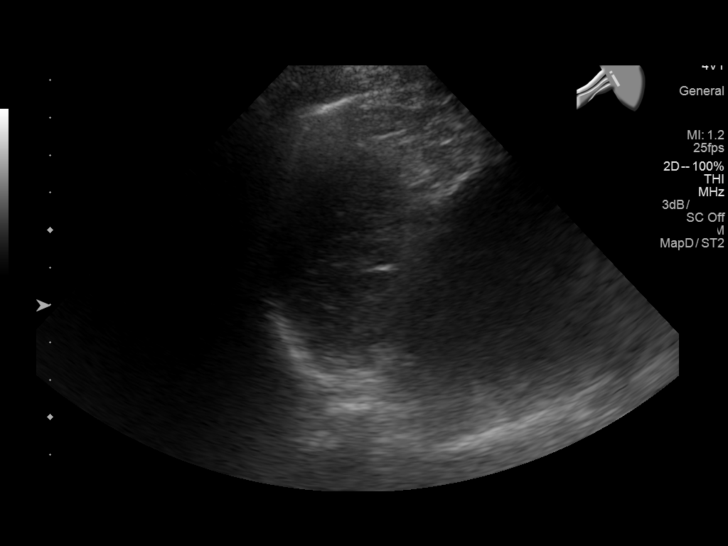
[im 60/80]
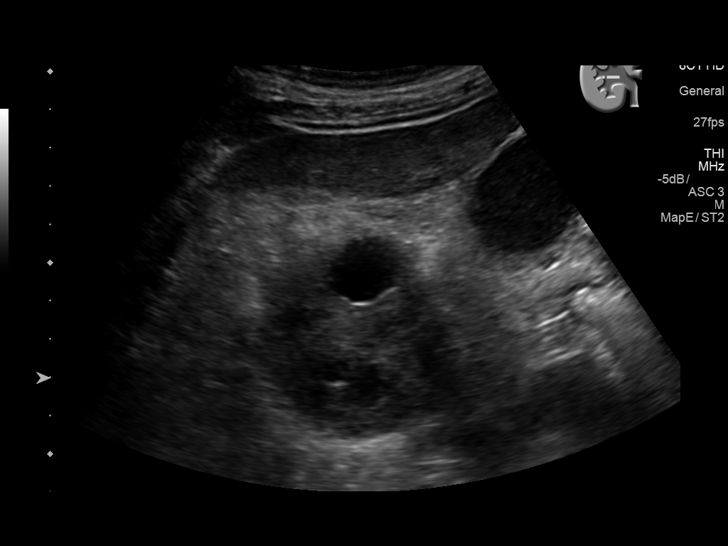
[im 66/80]
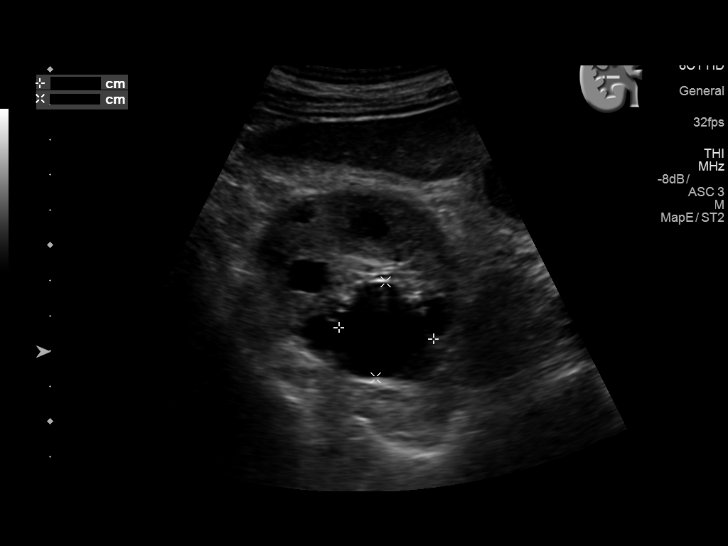
[im 73/80]
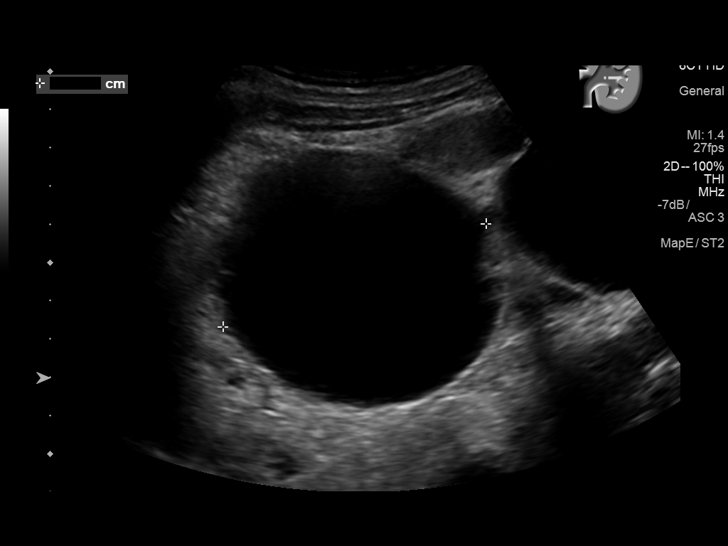
[im 80/80]
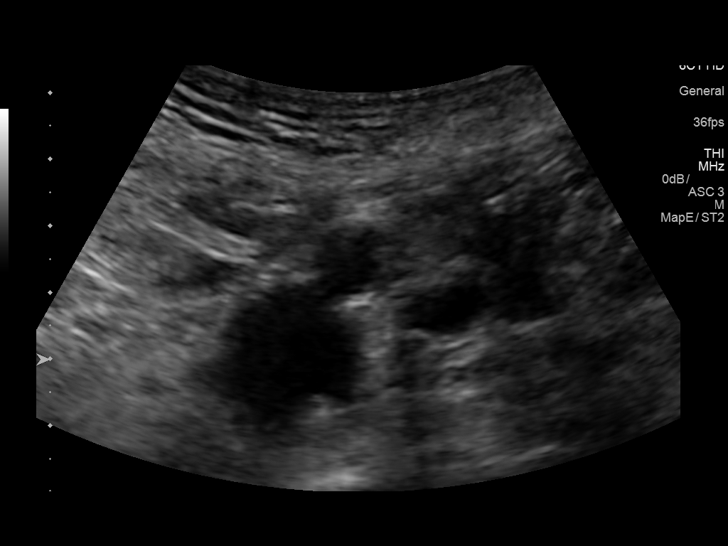

[13 of 25 positions shown; findings below may reference images not displayed]

FINDINGS: Gallbladder: Moderate sludge in the gallbladder without shadowing
stone. Negative sonographic Murphy. Wall thickness within normal
limits at 3 mm. Small amount of fluid near the gallbladder.

Common bile duct: Diameter: 5 mm

Liver: No focal lesion identified. Within normal limits in
parenchymal echogenicity. Portal vein is patent on color Doppler
imaging with normal direction of blood flow towards the liver.

IVC: No abnormality visualized.

Pancreas: Not seen due to bowel gas.

Spleen: Size and appearance within normal limits.

Right Kidney: Length: 10.8 cm. No hydronephrosis. Multiple cysts.
Largest measured cyst is seen within the mid to upper pole and
measures 2.8 x 2.7 x 2.7 cm

Left Kidney: Length: 11.8 cm. No hydronephrosis. Multiple cysts.
Largest measured cyst in the upper pole measures 8.9 x 7.4 x 7.2 cm.

Abdominal aorta: Maximum AP diameter proximally of 2.9 cm. Aortic
atherosclerosis.

Other findings: Small amount of peri hepatic ascites.
IMPRESSION: 1. No focal hepatic abnormality. Negative for biliary dilatation by
sonography.
2. Gallbladder sludge without wall thickening or sonographic Murphy.
3. Small amount of fluid near the gallbladder, suspect that this is
related to generalized small amount of perihepatic ascites.
4. Multiple cysts within the kidneys.

## 2019-09-03 IMAGING — DX DG ABD PORTABLE 2V
2 series · 2 of 2 positions shown · non-contrast
Comparison: CT, 07/09/2018

CLINICAL DATA: Order requisition states vomiting, but pt states he
does not have any abdominal complaints. Reports 2-3 days ago his BM
was black but unsure about details.

EXAM:
PORTABLE ABDOMEN - 2 VIEW

[abdomen kub]
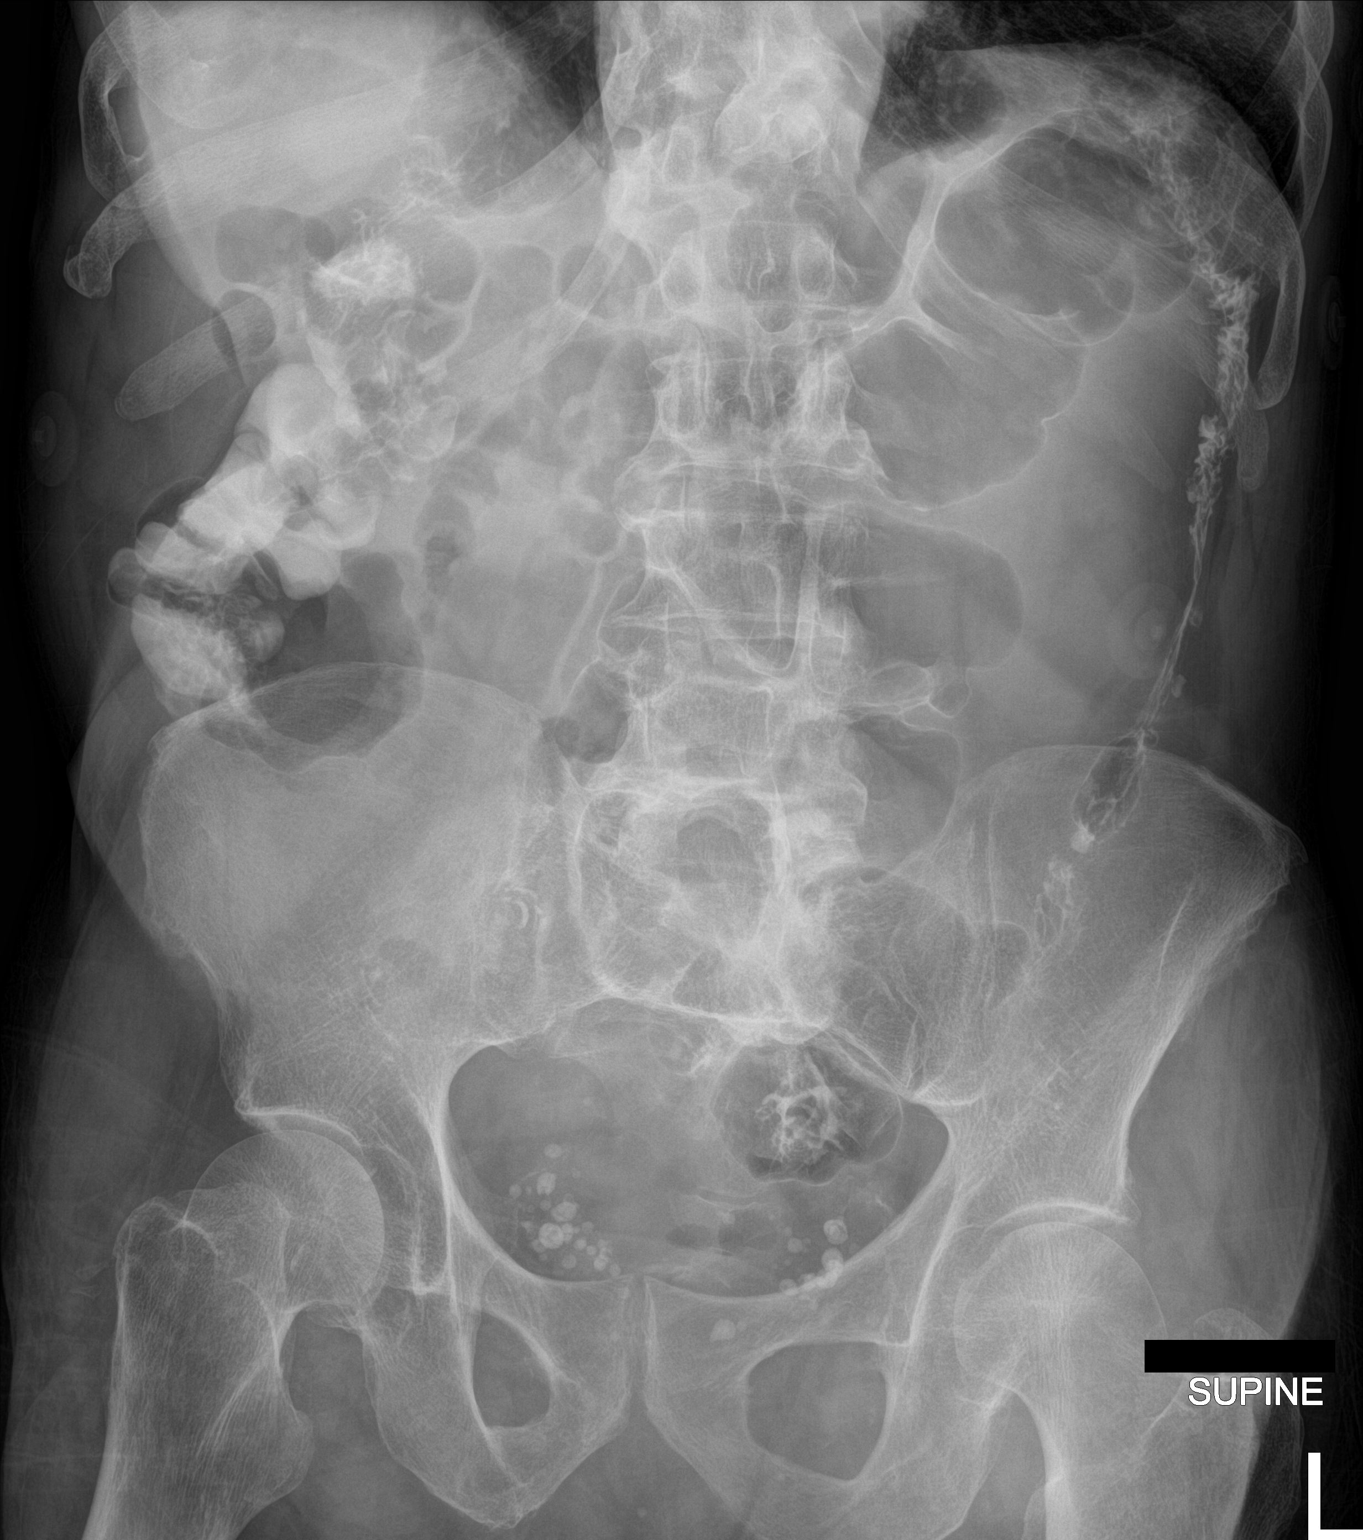

[abdomen decu]
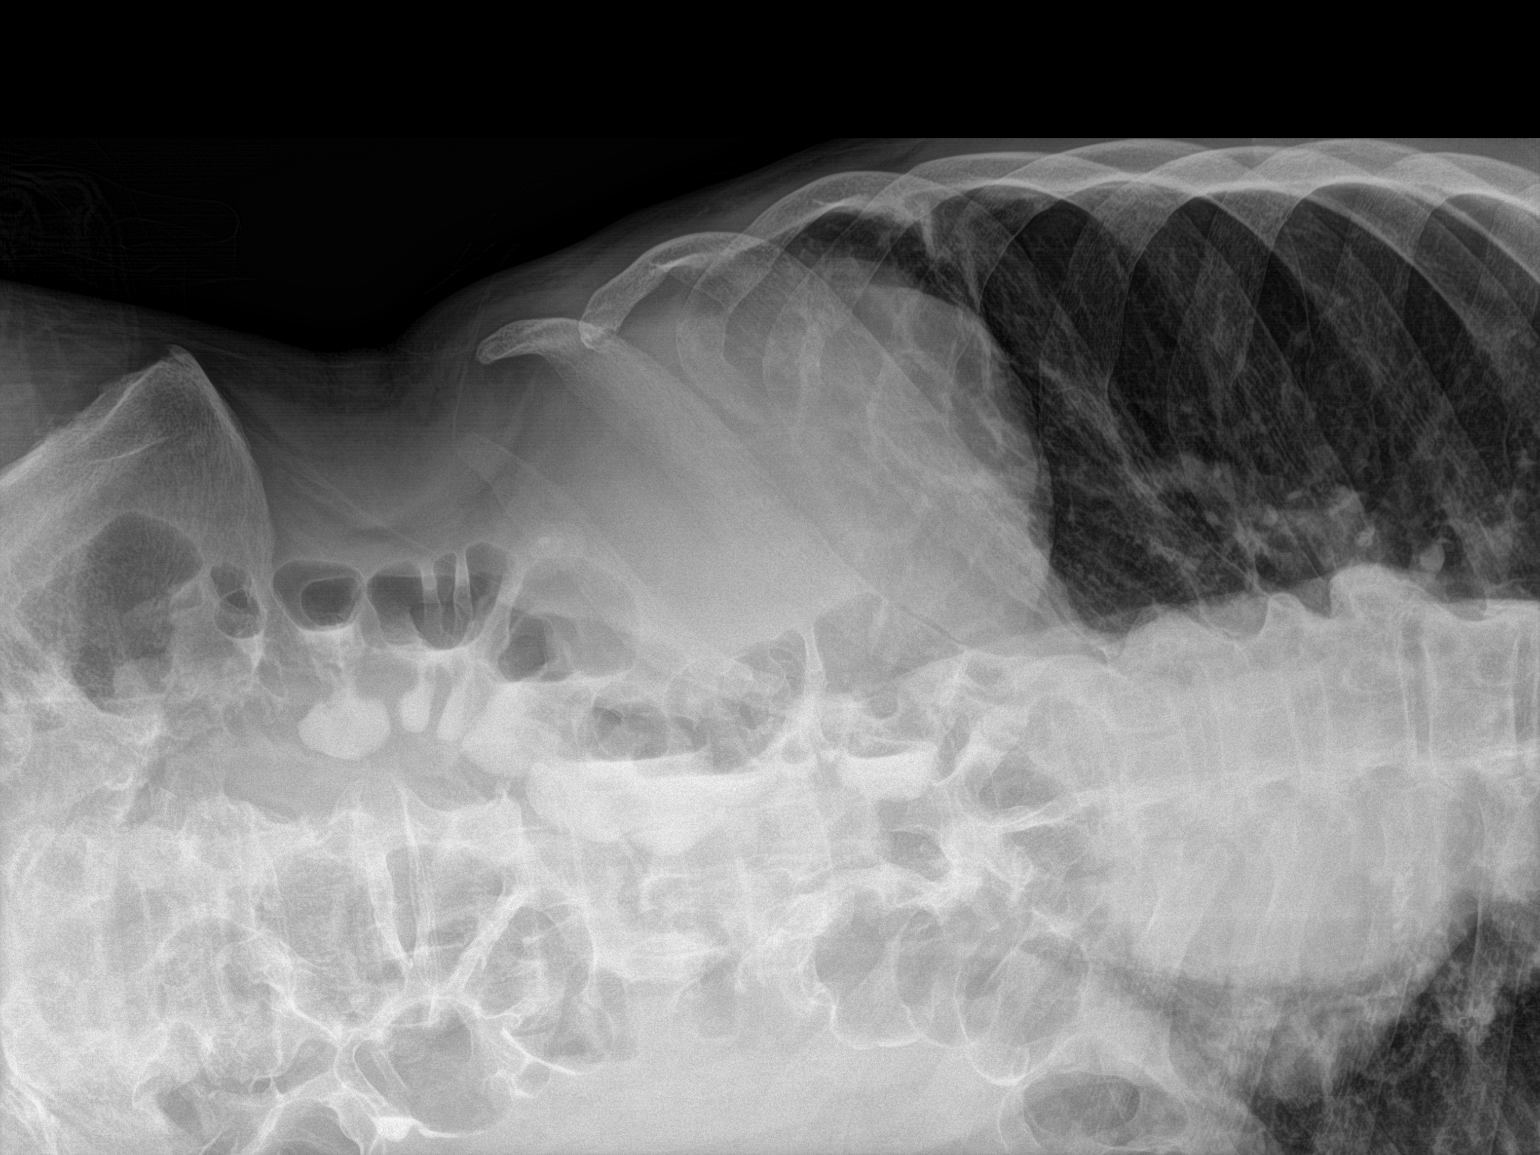

[2 of 2 positions shown; findings below may reference images not displayed]

FINDINGS: There is no bowel dilation to suggest obstruction. Residual contrast
is noted in the colon. There are few colonic air-fluid levels on the
decubitus view. No free air.
IMPRESSION: 1. No evidence of bowel obstruction or free air.
2. Few colonic air-fluid levels, nonspecific. No findings of a
significant adynamic ileus.

## 2020-09-07 ENCOUNTER — Encounter (HOSPITAL_COMMUNITY): Payer: Self-pay

## 2020-09-07 ENCOUNTER — Emergency Department (HOSPITAL_COMMUNITY): Payer: Medicare Other

## 2020-09-07 ENCOUNTER — Inpatient Hospital Stay (HOSPITAL_COMMUNITY): Payer: Medicare Other

## 2020-09-07 ENCOUNTER — Other Ambulatory Visit: Payer: Self-pay

## 2020-09-07 ENCOUNTER — Inpatient Hospital Stay (HOSPITAL_COMMUNITY)
Admission: EM | Admit: 2020-09-07 | Discharge: 2020-09-30 | DRG: 871 | Disposition: E | Payer: Medicare Other | Attending: Family Medicine | Admitting: Family Medicine

## 2020-09-07 DIAGNOSIS — G9340 Encephalopathy, unspecified: Secondary | ICD-10-CM

## 2020-09-07 DIAGNOSIS — Z961 Presence of intraocular lens: Secondary | ICD-10-CM | POA: Diagnosis present

## 2020-09-07 DIAGNOSIS — Z515 Encounter for palliative care: Secondary | ICD-10-CM | POA: Diagnosis not present

## 2020-09-07 DIAGNOSIS — R402362 Coma scale, best motor response, obeys commands, at arrival to emergency department: Secondary | ICD-10-CM | POA: Diagnosis present

## 2020-09-07 DIAGNOSIS — N19 Unspecified kidney failure: Secondary | ICD-10-CM | POA: Diagnosis not present

## 2020-09-07 DIAGNOSIS — M109 Gout, unspecified: Secondary | ICD-10-CM | POA: Diagnosis present

## 2020-09-07 DIAGNOSIS — E785 Hyperlipidemia, unspecified: Secondary | ICD-10-CM | POA: Diagnosis present

## 2020-09-07 DIAGNOSIS — N184 Chronic kidney disease, stage 4 (severe): Secondary | ICD-10-CM | POA: Diagnosis present

## 2020-09-07 DIAGNOSIS — J9811 Atelectasis: Secondary | ICD-10-CM | POA: Diagnosis present

## 2020-09-07 DIAGNOSIS — Z87891 Personal history of nicotine dependence: Secondary | ICD-10-CM

## 2020-09-07 DIAGNOSIS — D72829 Elevated white blood cell count, unspecified: Secondary | ICD-10-CM | POA: Diagnosis present

## 2020-09-07 DIAGNOSIS — Z9841 Cataract extraction status, right eye: Secondary | ICD-10-CM

## 2020-09-07 DIAGNOSIS — M6282 Rhabdomyolysis: Secondary | ICD-10-CM | POA: Diagnosis present

## 2020-09-07 DIAGNOSIS — N179 Acute kidney failure, unspecified: Secondary | ICD-10-CM | POA: Diagnosis present

## 2020-09-07 DIAGNOSIS — I639 Cerebral infarction, unspecified: Secondary | ICD-10-CM | POA: Diagnosis present

## 2020-09-07 DIAGNOSIS — Z789 Other specified health status: Secondary | ICD-10-CM | POA: Diagnosis not present

## 2020-09-07 DIAGNOSIS — A419 Sepsis, unspecified organism: Principal | ICD-10-CM | POA: Diagnosis present

## 2020-09-07 DIAGNOSIS — R6520 Severe sepsis without septic shock: Secondary | ICD-10-CM

## 2020-09-07 DIAGNOSIS — M1990 Unspecified osteoarthritis, unspecified site: Secondary | ICD-10-CM | POA: Diagnosis present

## 2020-09-07 DIAGNOSIS — R402132 Coma scale, eyes open, to sound, at arrival to emergency department: Secondary | ICD-10-CM | POA: Diagnosis present

## 2020-09-07 DIAGNOSIS — H524 Presbyopia: Secondary | ICD-10-CM | POA: Diagnosis present

## 2020-09-07 DIAGNOSIS — R402212 Coma scale, best verbal response, none, at arrival to emergency department: Secondary | ICD-10-CM | POA: Diagnosis present

## 2020-09-07 DIAGNOSIS — H401130 Primary open-angle glaucoma, bilateral, stage unspecified: Secondary | ICD-10-CM | POA: Diagnosis present

## 2020-09-07 DIAGNOSIS — E860 Dehydration: Secondary | ICD-10-CM | POA: Diagnosis present

## 2020-09-07 DIAGNOSIS — W1830XA Fall on same level, unspecified, initial encounter: Secondary | ICD-10-CM | POA: Diagnosis present

## 2020-09-07 DIAGNOSIS — I129 Hypertensive chronic kidney disease with stage 1 through stage 4 chronic kidney disease, or unspecified chronic kidney disease: Secondary | ICD-10-CM | POA: Diagnosis present

## 2020-09-07 DIAGNOSIS — Z66 Do not resuscitate: Secondary | ICD-10-CM

## 2020-09-07 DIAGNOSIS — R4182 Altered mental status, unspecified: Secondary | ICD-10-CM | POA: Diagnosis not present

## 2020-09-07 DIAGNOSIS — E875 Hyperkalemia: Secondary | ICD-10-CM | POA: Diagnosis present

## 2020-09-07 DIAGNOSIS — E872 Acidosis: Secondary | ICD-10-CM | POA: Diagnosis present

## 2020-09-07 DIAGNOSIS — F0390 Unspecified dementia without behavioral disturbance: Secondary | ICD-10-CM | POA: Diagnosis present

## 2020-09-07 DIAGNOSIS — Z8249 Family history of ischemic heart disease and other diseases of the circulatory system: Secondary | ICD-10-CM

## 2020-09-07 DIAGNOSIS — R7401 Elevation of levels of liver transaminase levels: Secondary | ICD-10-CM | POA: Diagnosis present

## 2020-09-07 DIAGNOSIS — S36119A Unspecified injury of liver, initial encounter: Secondary | ICD-10-CM | POA: Diagnosis present

## 2020-09-07 DIAGNOSIS — R531 Weakness: Secondary | ICD-10-CM

## 2020-09-07 DIAGNOSIS — H25812 Combined forms of age-related cataract, left eye: Secondary | ICD-10-CM | POA: Diagnosis present

## 2020-09-07 DIAGNOSIS — R296 Repeated falls: Secondary | ICD-10-CM | POA: Diagnosis present

## 2020-09-07 DIAGNOSIS — Z7189 Other specified counseling: Secondary | ICD-10-CM | POA: Diagnosis not present

## 2020-09-07 DIAGNOSIS — Z79899 Other long term (current) drug therapy: Secondary | ICD-10-CM

## 2020-09-07 DIAGNOSIS — D631 Anemia in chronic kidney disease: Secondary | ICD-10-CM | POA: Diagnosis present

## 2020-09-07 DIAGNOSIS — N183 Chronic kidney disease, stage 3 unspecified: Secondary | ICD-10-CM

## 2020-09-07 DIAGNOSIS — Z20822 Contact with and (suspected) exposure to covid-19: Secondary | ICD-10-CM | POA: Diagnosis present

## 2020-09-07 DIAGNOSIS — R54 Age-related physical debility: Secondary | ICD-10-CM | POA: Diagnosis present

## 2020-09-07 LAB — URINALYSIS, ROUTINE W REFLEX MICROSCOPIC
Bilirubin Urine: NEGATIVE
Glucose, UA: NEGATIVE mg/dL
Ketones, ur: NEGATIVE mg/dL
Nitrite: NEGATIVE
Protein, ur: 100 mg/dL — AB
RBC / HPF: >50 RBC/hpf — ABNORMAL HIGH (ref 0–5)
Specific Gravity, Urine: 1.013 (ref 1.005–1.030)
WBC, UA: >50 WBC/hpf — ABNORMAL HIGH (ref 0–5)
pH: 5 (ref 5.0–8.0)

## 2020-09-07 LAB — BASIC METABOLIC PANEL
Anion gap: 19 — ABNORMAL HIGH (ref 5–15)
BUN: 150 mg/dL — ABNORMAL HIGH (ref 8–23)
CO2: 12 mmol/L — ABNORMAL LOW (ref 22–32)
Calcium: 8.1 mg/dL — ABNORMAL LOW (ref 8.9–10.3)
Chloride: 116 mmol/L — ABNORMAL HIGH (ref 98–111)
Creatinine, Ser: 7.55 mg/dL — ABNORMAL HIGH (ref 0.61–1.24)
GFR, Estimated: 6 mL/min — ABNORMAL LOW (ref >60)
Glucose, Bld: 117 mg/dL — ABNORMAL HIGH (ref 70–99)
Potassium: 5.8 mmol/L — ABNORMAL HIGH (ref 3.5–5.1)
Sodium: 147 mmol/L — ABNORMAL HIGH (ref 135–145)

## 2020-09-07 LAB — I-STAT ARTERIAL BLOOD GAS, ED
Acid-base deficit: 8 mmol/L — ABNORMAL HIGH (ref 0.0–2.0)
Bicarbonate: 15.1 mmol/L — ABNORMAL LOW (ref 20.0–28.0)
Calcium, Ion: 1.12 mmol/L — ABNORMAL LOW (ref 1.15–1.40)
HCT: 43 % (ref 39.0–52.0)
Hemoglobin: 14.6 g/dL (ref 13.0–17.0)
O2 Saturation: 100 %
Patient temperature: 94.7
Potassium: 5.7 mmol/L — ABNORMAL HIGH (ref 3.5–5.1)
Sodium: 144 mmol/L (ref 135–145)
TCO2: 16 mmol/L — ABNORMAL LOW (ref 22–32)
pCO2 arterial: 23 mmHg — ABNORMAL LOW (ref 32.0–48.0)
pH, Arterial: 7.416 (ref 7.350–7.450)
pO2, Arterial: 163 mmHg — ABNORMAL HIGH (ref 83.0–108.0)

## 2020-09-07 LAB — RAPID URINE DRUG SCREEN, HOSP PERFORMED
Amphetamines: NOT DETECTED
Barbiturates: NOT DETECTED
Benzodiazepines: NOT DETECTED
Cocaine: NOT DETECTED
Opiates: NOT DETECTED
Tetrahydrocannabinol: NOT DETECTED

## 2020-09-07 LAB — RESPIRATORY PANEL BY RT PCR (FLU A&B, COVID)
Influenza A by PCR: NEGATIVE
Influenza B by PCR: NEGATIVE
SARS Coronavirus 2 by RT PCR: NEGATIVE

## 2020-09-07 LAB — COMPREHENSIVE METABOLIC PANEL
ALT: 143 U/L — ABNORMAL HIGH (ref 0–44)
AST: 113 U/L — ABNORMAL HIGH (ref 15–41)
Albumin: 3.9 g/dL (ref 3.5–5.0)
Alkaline Phosphatase: 52 U/L (ref 38–126)
Anion gap: 26 — ABNORMAL HIGH (ref 5–15)
BUN: 149 mg/dL — ABNORMAL HIGH (ref 8–23)
CO2: 14 mmol/L — ABNORMAL LOW (ref 22–32)
Calcium: 9.9 mg/dL (ref 8.9–10.3)
Chloride: 107 mmol/L (ref 98–111)
Creatinine, Ser: 8.8 mg/dL — ABNORMAL HIGH (ref 0.61–1.24)
GFR, Estimated: 5 mL/min — ABNORMAL LOW (ref >60)
Glucose, Bld: 145 mg/dL — ABNORMAL HIGH (ref 70–99)
Potassium: 5.9 mmol/L — ABNORMAL HIGH (ref 3.5–5.1)
Sodium: 147 mmol/L — ABNORMAL HIGH (ref 135–145)
Total Bilirubin: 1.7 mg/dL — ABNORMAL HIGH (ref 0.3–1.2)
Total Protein: 7.5 g/dL (ref 6.5–8.1)

## 2020-09-07 LAB — CBC WITH DIFFERENTIAL/PLATELET
Abs Immature Granulocytes: 0.33 10*3/uL — ABNORMAL HIGH (ref 0.00–0.07)
Basophils Absolute: 0 10*3/uL (ref 0.0–0.1)
Basophils Relative: 0 %
Eosinophils Absolute: 0 10*3/uL (ref 0.0–0.5)
Eosinophils Relative: 0 %
HCT: 44.3 % (ref 39.0–52.0)
Hemoglobin: 14.5 g/dL (ref 13.0–17.0)
Immature Granulocytes: 3 %
Lymphocytes Relative: 9 %
Lymphs Abs: 1 10*3/uL (ref 0.7–4.0)
MCH: 28.1 pg (ref 26.0–34.0)
MCHC: 32.7 g/dL (ref 30.0–36.0)
MCV: 85.9 fL (ref 80.0–100.0)
Monocytes Absolute: 0.9 10*3/uL (ref 0.1–1.0)
Monocytes Relative: 7 %
Neutro Abs: 9.5 10*3/uL — ABNORMAL HIGH (ref 1.7–7.7)
Neutrophils Relative %: 81 %
Platelets: 137 10*3/uL — ABNORMAL LOW (ref 150–400)
RBC: 5.16 MIL/uL (ref 4.22–5.81)
RDW: 16 % — ABNORMAL HIGH (ref 11.5–15.5)
WBC: 11.8 10*3/uL — ABNORMAL HIGH (ref 4.0–10.5)
nRBC: 1 % — ABNORMAL HIGH (ref 0.0–0.2)

## 2020-09-07 LAB — PROTIME-INR
INR: 1.3 — ABNORMAL HIGH (ref 0.8–1.2)
Prothrombin Time: 15.4 seconds — ABNORMAL HIGH (ref 11.4–15.2)

## 2020-09-07 LAB — TROPONIN I (HIGH SENSITIVITY)
Troponin I (High Sensitivity): 90 ng/L — ABNORMAL HIGH (ref <18)
Troponin I (High Sensitivity): 91 ng/L — ABNORMAL HIGH (ref <18)

## 2020-09-07 LAB — CBG MONITORING, ED: Glucose-Capillary: 113 mg/dL — ABNORMAL HIGH (ref 70–99)

## 2020-09-07 LAB — I-STAT CHEM 8, ED
BUN: >130 mg/dL — ABNORMAL HIGH (ref 8–23)
Calcium, Ion: 1.05 mmol/L — ABNORMAL LOW (ref 1.15–1.40)
Chloride: 114 mmol/L — ABNORMAL HIGH (ref 98–111)
Creatinine, Ser: 9.2 mg/dL — ABNORMAL HIGH (ref 0.61–1.24)
Glucose, Bld: 144 mg/dL — ABNORMAL HIGH (ref 70–99)
HCT: 47 % (ref 39.0–52.0)
Hemoglobin: 16 g/dL (ref 13.0–17.0)
Potassium: 5.6 mmol/L — ABNORMAL HIGH (ref 3.5–5.1)
Sodium: 144 mmol/L (ref 135–145)
TCO2: 15 mmol/L — ABNORMAL LOW (ref 22–32)

## 2020-09-07 LAB — SODIUM, URINE, RANDOM: Sodium, Ur: 67 mmol/L

## 2020-09-07 LAB — ETHANOL: Alcohol, Ethyl (B): <10 mg/dL (ref <10)

## 2020-09-07 LAB — CK: Total CK: 657 U/L — ABNORMAL HIGH (ref 49–397)

## 2020-09-07 LAB — TYPE AND SCREEN
ABO/RH(D): A POS
Antibody Screen: NEGATIVE

## 2020-09-07 LAB — ABO/RH: ABO/RH(D): A POS

## 2020-09-07 LAB — LACTIC ACID, PLASMA
Lactic Acid, Venous: 3.6 mmol/L (ref 0.5–1.9)
Lactic Acid, Venous: 3.2 mmol/L (ref 0.5–1.9)

## 2020-09-07 LAB — CREATININE, URINE, RANDOM: Creatinine, Urine: 118.38 mg/dL

## 2020-09-07 LAB — AMMONIA: Ammonia: 28 umol/L (ref 9–35)

## 2020-09-07 MED ORDER — SODIUM CHLORIDE 0.9 % IV SOLN: INTRAVENOUS | Status: DC

## 2020-09-07 MED ORDER — SODIUM CHLORIDE 0.9 % IV SOLN: Freq: Once | INTRAVENOUS | Status: AC

## 2020-09-07 MED ORDER — SODIUM CHLORIDE 0.9 % IV BOLUS
1000.0000 mL | Freq: Once | INTRAVENOUS | Status: AC
  Administered 2020-09-07: 1000 mL via INTRAVENOUS

## 2020-09-07 MED ORDER — ACETAMINOPHEN 650 MG RE SUPP: 650.0000 mg | Freq: Four times a day (QID) | RECTAL | Status: DC | PRN

## 2020-09-07 MED ORDER — SODIUM CHLORIDE 0.9 % IV SOLN
2.0000 g | INTRAVENOUS | Status: DC
  Administered 2020-09-07: 2 g via INTRAVENOUS
  Filled 2020-09-07: qty 20

## 2020-09-07 MED ORDER — ACETAMINOPHEN 325 MG PO TABS: 650.0000 mg | ORAL_TABLET | Freq: Four times a day (QID) | ORAL | Status: DC | PRN

## 2020-09-07 MED ORDER — HEPARIN SODIUM (PORCINE) 5000 UNIT/ML IJ SOLN
5000.0000 [IU] | Freq: Three times a day (TID) | INTRAMUSCULAR | Status: DC
  Administered 2020-09-07 – 2020-09-08 (×3): 5000 [IU] via SUBCUTANEOUS
  Filled 2020-09-07 (×3): qty 1

## 2020-09-07 MED ORDER — SODIUM CHLORIDE 0.9 % IV BOLUS
2500.0000 mL | Freq: Once | INTRAVENOUS | Status: AC
  Administered 2020-09-07: 2500 mL via INTRAVENOUS

## 2020-09-07 NOTE — Consult Note (Signed)
Renal Service Consult Note [facility]  Matthew Winters 08/30/2020 Matthew Winters Requesting Physician: Dr C. Owens Shark  Reason for Consult:  Renal failrue HPI: The patient is a 84 y.o. year-old w/ hx of HTN, HL, gout, DJD brought to ED from home by EMS for AMS and multiple falls.  First fell 2 wks ago and another fall yesterday. Pt laying on the floor since yesterday , per the brother.  In ED pt was poorly responsive. Per dtr he has had progressive dementia symptoms. Poor po intake over last several days. Creat 8.80 in ED, last creat was 2.6 in Oct 2019.  Asked to see for renal failure.   Pt confused and lethargic, unable to answer questions. Spoke to daughter who last saw him "normal" , up and walking , about 2 months ago.  After seeing him today she says he has lost " a lot of weight".      ROS - n/a   Past Medical History  Past Medical History:  Diagnosis Date  . Arthritis   . Cataract   . Glaucoma   . Gout   . History of blood transfusion   . Hyperlipidemia   . Hypertension    Past Surgical History  Past Surgical History:  Procedure Laterality Date  . CATARACT EXTRACTION W/PHACO  07/27/2012   Procedure: CATARACT EXTRACTION PHACO AND INTRAOCULAR LENS PLACEMENT (IOC);  Surgeon: Marylynn Pearson, MD;  Location: [place];  Service: Ophthalmology;  Laterality: Right;  . COLONOSCOPY    . COLONOSCOPY W/ POLYPECTOMY    . EYE SURGERY  2012   glaucoma surgery - right  . MULTIPLE EXTRACTIONS WITH ALVEOLOPLASTY N/A 10/16/2013   Procedure: MULTIPLE EXTRACION WITH ALVEOLOPLASTY;  Surgeon: Gae Bon, DDS;  Location: [place];  Service: Oral Surgery;  Laterality: N/A;  . POLYPECTOMY     Family History  Family History  Problem Relation Age of Onset  . Heart disease Mother   . Colon cancer Neg Hx   . Colon polyps Neg Hx   . Rectal cancer Neg Hx   . Stomach cancer Neg Hx    Social History  reports that he has quit smoking. He has never used smokeless tobacco.  He reports that he does not drink alcohol and does not use drugs. Allergies No Known Allergies Home medications Prior to Admission medications   Medication Sig Start Date End Date Taking? Authorizing Provider  COMBIGAN 0.2-0.5 % ophthalmic solution Place 1 drop into both eyes in the morning and at bedtime. 08/12/20  Yes [provider]  feeding supplement, ENSURE ENLIVE, (ENSURE ENLIVE) LIQD Take 237 mLs by mouth 3 (three) times daily between meals. 07/16/18  Yes Lavina Hamman, MD  LUMIGAN 0.01 % SOLN Place 1 drop into both eyes at bedtime. 08/20/20  Yes [provider]  RESTASIS 0.05 % ophthalmic emulsion Place 1 drop into both eyes daily. 08/20/20  Yes [provider]  RHOPRESSA 0.02 % SOLN Place 1 drop into both eyes at bedtime. 08/14/20  Yes [provider]  megestrol (MEGACE) 400 MG/10ML suspension Take 10 mLs (400 mg total) by mouth daily. Patient not taking: Reported on 09/28/2020 07/17/18   Lavina Hamman, MD  metoCLOPramide (REGLAN) 10 MG tablet Take 1 tablet (10 mg total) by mouth 3 (three) times daily before meals for 4 days. Patient not taking: Reported on 09/13/2020 07/16/18 07/20/18  Lavina Hamman, MD  ondansetron (ZOFRAN) 4 MG tablet Take 1 tablet (4 mg total) by mouth  every 6 (six) hours as needed for nausea. Do Not take while taking metoclopramide. Patient not taking: Reported on 09/29/2020 07/20/18   Lavina Hamman, MD  polyethylene glycol [facility] / Floria Raveling) packet Take 17 g by mouth daily. Patient not taking: Reported on 09/15/2020 07/17/18   Lavina Hamman, MD     Vitals:   09/26/2020 1300 09/27/2020 1400 09/01/2020 1500 09/06/2020 1600  BP: 98/65 101/72 104/73 109/72  Pulse: (!) 50 98 (!) 101 (!) 101  Resp: (!) 22 (!) 25 (!) 25 (!) 21  Temp: (!) 97 F (36.1 C) 98.6 F (37 C) 99 F (37.2 C) 98.7 F (37.1 C)  TempSrc:      SpO2: 100% 94% 97% 96%  Weight:      Height:       Exam Gen lethargic, arouses and shouts and goes back to  sleep Lying on his side  No rash, cyanosis or gangrene Sclera anicteric, throat clear and dry Poor skin turgor No jvd or bruits Chest clear bilat to base, poor air movement, no bronch bs RRR no MRG Abd soft ntnd no mass or ascites +bs GU normal male w/ foley in place MS no joint effusions or deformity Ext no leg or UE edema, no wounds or ulcers Neuro is alert, Ox 3 , nf    Home meds:  - megace 400 qd/ reglan 10 qid x 4 days/ zofran prn  - miralax prn  - eye drops x 4  - prn's/ vitamins/ supplements    Date  Creat  eGFR (ml/min)/ other   2009  3.6  2013  2.17  33  2014  2.50  06/2018 3.4 >> 1.82  AKI episode, 18 >> 39 ml/min  08/2018 2.63  25   09/13/2020 8.80  5       Last UA from aug 2019 - neg protein, 0-5 rbc/ epi, no bact    UA pending    UNa/ UCr pending     CXR - IMPRESSION: Low lung volume exam with bibasilar  opacities favored to represent atelectasis. Infection not excluded.     WBC 11.8, Hb 14.5  plt 137          Na 144  K 5.6  CO2 14  BUN 149  Cr 8.80  Ca 9.9        Alb 3.9  AST 113/ !LT 143   Total prot 7.5  Tbili 1.7        CPK 657     ABG 7.41/ 23/ 163        BP 140 /90 -  98/ 65  - 104/73  - 126/78         [place] O2 2L , 97%- 100%         Has rec'd 1 L NS bolus   Assessment/ Plan: 1. AoCKD 4 - b/l creat 1.8- 2.6 from 2019, eGFR 25-39. Here w/ wt loss, confusion, creat 8.80.  Looks sig dry on exam. BP's soft to normal here. No nsaid/ acei on home med list.  Pt may have progression of CKD vs dehydration related AKI. Pt lethargic and not answering questions. May have uremia.  Favor hydrating aggressively and see if MS improves. Will rebolus 2.5L and keep the IVF's at 125/ hr . Foley in place. Get renal US, UA and urine lytes when urine is available. Will follow.  2. HTN - by history, not on any BP lowering meds 3. H/o gout      Rob  Avana Kreiser  MD 08/30/2020, 5:18 PM  Recent Labs  Lab 09/14/2020 0954 09/06/2020 1009  WBC 11.8*  --   HGB 14.5 16.0   Recent Labs   Lab 09/22/2020 0954 09/23/2020 1009  K 5.9* 5.6*  BUN 149* >130*  CREATININE 8.80* 9.20*  CALCIUM 9.9  --

## 2020-09-07 NOTE — ED Notes (Signed)
Patient transported to CT 

## 2020-09-07 NOTE — Progress Notes (Signed)
FPTS Interim Progress Note PM check: Matthew Winters is a 84 yo M p/w AMS, AKI, hyperkalemia and c/f acute infarct post fall. Met SIRS criteria etiology unknown but presumed sacarl wound. Continue on CTX. PMHx s/f dementia and CKD. He continues to be non-verbal but responding to touch stimuli and eyes are now open. GCS improved to 7 from 5 on admission. VSS improve s/p 2.5L bolus continued on 131m IVF. BMP pending. Will follow up labs and continue monitor vitals/improvement. Planning for GRapidsw/ family tomorrow.   AGerlene Fee DO 08/30/2020, 10:01 PM PGY-2, CS[facility]ito[facility] Service pager [phone]

## 2020-09-07 NOTE — ED Triage Notes (Signed)
Pt from home with ems for AMS and multiple falls.  First fall 2 weeks ago and a fall yesterday (unwitnessed but brother heard the fall). Pt been laying in floor since. GCS 8, not following commands.   Bp 132 palp 97% room air, shallow resp 24 ET CO2 18 CBG 150 Pupils unreactive, cataract in right eye

## 2020-09-07 NOTE — Consult Note (Signed)
Consultation Note Date: 09/27/2020   Patient Name: Matthew Winters  DOB: 1936-08-05  MRN: [id number]  Age / Sex: 84 y.o., male  PCP: [name], MD Referring Physician: Valarie Merino, MD  Reason for Consultation: Establishing goals of care   HPI/Patient Profile: 84 y.o. male  with past medical history of hypertension, hyperlipidemia, and advanced dementia was evaluated in the ED on 09/06/2020 for AMS and multiple falls at home. Patient's history is significant for gradually worsening progressive symptoms of dementia. Admitting providers were consulted on 09/02/2020 for AMS s/p fall, AKI, acute transaminitis, and concern for acute infarct.   Patient and family face treatment option decisions, advanced directive decisions, and anticipatory care needs.   Clinical Assessment and Goals of Care: I have reviewed medical records including EPIC notes, labs, and imaging. Received report from attending providers and primary RN. RN stated that daughter/Margaret was at bedside but recently left - thought to go discuss patient's current medical situation with her two sisters.   Went to visit patient at bedside - no family/visitors present. Patient was lying in bed with eyes closed - he did respond to voice and gentle touch only once, he lifted his right arm and moaned; he did not open his eyes, did not verbally respond to questions, and was not able to participate in conversation. When not being touched, no signs or non-verbal gestures of pain noted. No respiratory distress, increased work of breathing, or secretions noted. Patient was very frail and ill appearing.  2:50pm After visiting with patient, attempted to call daughter/Margaret to set up family meeting with all three daughters - no answer - left voicemail to return call and provided PMT number.   7:05pm Received return phone call from daughter/Margaret - family meeting  scheduled for 09/08/2020 for at 2pm.  Primary Decision Maker: NEXT OF KIN - no HCPOA on file. Per [place] law, decisions will be made by the majority of patient's reasonably available adult children. Patient has three daughters.     SUMMARY OF RECOMMENDATIONS:  Continue current full scope medical treatment  Continue DNR/DNI as previously documented  Family meeting with three sisters scheduled for 09/11/2020 at 2pm.   PMT will continue to follow holistically  Code Status/Advance Care Planning:  DNR  Palliative Prophylaxis:   Aspiration, Bowel Regimen, Delirium Protocol, Frequent Pain Assessment, Oral Care and Turn Reposition  Additional Recommendations (Limitations, Scope, Preferences):  Full Scope Treatment  Psycho-social/Spiritual:   Desire for further Chaplaincy support: unable to assess Created space and opportunity for patient and family to express thoughts and feelings regarding patient's current medical situation.   Emotional support provided.  Prognosis:   Unable to determine  Discharge Planning: To Be Determined      Primary Diagnoses: Present on Admission: **None**   I have reviewed the medical record, interviewed the patient and family, and examined the patient. The following aspects are pertinent.  Past Medical History:  Diagnosis Date  . Arthritis   . Cataract   . Glaucoma   . Gout   .  History of blood transfusion   . Hyperlipidemia   . Hypertension    Social History   Socioeconomic History  . Marital status: Widowed    Spouse name: Not on file  . Number of children: Not on file  . Years of education: Not on file  . Highest education level: Not on file  Occupational History  . Not on file  Tobacco Use  . Smoking status: Former Research scientist (life sciences)  . Smokeless tobacco: Never Used  Vaping Use  . Vaping Use: Never used  Substance and Sexual Activity  . Alcohol use: No    Alcohol/week: 0.0 standard drinks  . Drug use: No  . Sexual activity: Not Currently   Other Topics Concern  . Not on file  Social History Narrative  . Not on file   Social Determinants of Health   Financial Resource Strain:   . Difficulty of Paying Living Expenses: Not on file  Food Insecurity:   . Worried About Charity fundraiser in the Last Year: Not on file  . Ran Out of Food in the Last Year: Not on file  Transportation Needs:   . Lack of Transportation (Medical): Not on file  . Lack of Transportation (Non-Medical): Not on file  Physical Activity:   . Days of Exercise per Week: Not on file  . Minutes of Exercise per Session: Not on file  Stress:   . Feeling of Stress : Not on file  Social Connections:   . Frequency of Communication with Friends and Family: Not on file  . Frequency of Social Gatherings with Friends and Family: Not on file  . Attends Religious Services: Not on file  . Active Member of Clubs or Organizations: Not on file  . Attends Archivist Meetings: Not on file  . Marital Status: Not on file   Family History  Problem Relation Age of Onset  . Heart disease Mother   . Colon cancer Neg Hx   . Colon polyps Neg Hx   . Rectal cancer Neg Hx   . Stomach cancer Neg Hx    Scheduled Meds: Continuous Infusions: PRN Meds:. Medications Prior to Admission:  Prior to Admission medications   Medication Sig Start Date End Date Taking? Authorizing Provider  AZOPT 1 % ophthalmic suspension Place 1 drop into both eyes 3 (three) times daily. 07/01/18   [provider]  bimatoprost (LUMIGAN) 0.03 % ophthalmic solution Place 1 drop into both eyes at bedtime.    [provider]  colchicine 0.6 MG tablet Take 0.6 mg by mouth daily as needed (gout flares).     [provider]  feeding supplement, ENSURE ENLIVE, (ENSURE ENLIVE) LIQD Take 237 mLs by mouth 3 (three) times daily between meals. 07/16/18   Lavina Hamman, MD  megestrol (MEGACE) 400 MG/10ML suspension Take 10 mLs (400 mg total) by mouth daily. 07/17/18   Lavina Hamman, MD  metoCLOPramide (REGLAN) 10 MG tablet Take 1 tablet (10 mg total) by mouth 3 (three) times daily before meals for 4 days. 07/16/18 07/20/18  Lavina Hamman, MD  ondansetron (ZOFRAN) 4 MG tablet Take 1 tablet (4 mg total) by mouth every 6 (six) hours as needed for nausea. Do Not take while taking metoclopramide. 07/20/18   Lavina Hamman, MD  polyethylene glycol [facility] / Floria Raveling) packet Take 17 g by mouth daily. 07/17/18   Lavina Hamman, MD  timolol (TIMOPTIC) 0.5 % ophthalmic solution Place 1 drop into both eyes daily. 07/01/18  [provider]   No Known Allergies Review of Systems  Unable to perform ROS: Acuity of condition    Physical Exam Vitals and nursing note reviewed.  Constitutional:      General: He is not in acute distress.    Appearance: He is ill-appearing.     Comments: Frail appearing  Pulmonary:     Effort: No respiratory distress.  Skin:    General: Skin is warm and dry.  Neurological:     Mental Status: He is lethargic and confused.     Motor: Weakness present.  Psychiatric:        Speech: He is noncommunicative.     Vital Signs: BP 101/72   Pulse 98   Temp 98.6 F (37 C)   Resp (!) 25   Ht 5\' 6"  (1.676 m)   Wt 80 kg   SpO2 94%   BMI 28.47 kg/m  Pain Scale: Faces       SpO2: SpO2: 94 % O2 Device:SpO2: 94 % O2 Flow Rate: .O2 Flow Rate (L/min): 2 L/min  IO: Intake/output summary:   Intake/Output Summary (Last 24 hours) at 09/29/2020 1450 Last data filed at 09/27/2020 1125 Gross per 24 hour  Intake 1000 ml  Output --  Net 1000 ml    LBM:   Baseline Weight: Weight: 80 kg Most recent weight: Weight: 80 kg     Palliative Assessment/Data: PPS 10%     Time In: 1450  Time Out: 1520 Time Total: 30 minutes  Greater than 50%  of this time was spent counseling and coordinating care related to the above assessment and plan.  Signed by: Lin Landsman, NP   Please contact Palliative Medicine Team phone at [phone]  for questions and concerns.  For individual provider: See Shea Evans

## 2020-09-07 NOTE — H&P (Addendum)
[facility] Admission History and Physical Service Pager: [phone]  Patient name: Matthew Winters Medical record number: [id number] Date of birth: 1936-01-31 Age: 84 y.o. Gender: male  Primary Care Provider: [name], MD Consultants: Palliative care  Code Status: FULL  Preferred Emergency Contact: Joycelyn Schmid (daughter): [phone]  Chief Complaint: Altered mental status, fall   Assessment and Plan: Tamas Suen is a 84 y.o. male presenting with AMS after a fall on 10/8. PMH is significant for dementia  AMS s/p fall. Meeting SIRS criteria.  Patient with known history of severe dementia fell yesterday and was found on the ground this morning. Patient is verbally unresponsive and only responsive to painful stimuli. GCS 5. Pt with no identifiable source of infection but came in with a temp of <93*F and a leukocytosis meeting 2/4 SIRS criteria.  No source of infection identified. .  Daughter Joycelyn Schmid is at bedside and giving (limited) history. Reports patient was diagnosed with dementia about 2 years ago, but has progressively declined in the past 2 months. Daughter unsure of other PMH. In ED patient received NS bolus. CT head and spine with no fractures or bleeding. . CXR unremarkable showing possible atelectasis. Pelvic US showing no fracture. EKG NSR. CO2 23, K+ 5.9, BUN 149, Cr 8.8, GFR 5. Anion gap 26. CK 657, lactic acid 3.6, Troponin 91. Joycelyn Schmid unsure of patient's goals of care and wants to talk things over with her 2 other sisters and consult with palliative care before making any decisions regarding treatment.. Given the patient's severe dementia at baseline and his current state of anuric kidney failure, possible liver failure, and unconscious state it is unlikely that he will survive this admission. nephro has been consulted and recommended aggressive fluid resuscitation. No plans for dialysis at this time.  Will treat as sepsis for now with fluids and abx while we  wait for family to have goals of care discussion with palliative care.    - admit to [facility], attending Dr. Owens Shark  - vitals per routine - mIVF 125 mL/hr.  S/p 3.5L IVF bolus.   - CTX  - am CBC, CMP - cardiac monitoring  - DNR - palliative care consult   Acute kidney failure  History of previous kidney disease unknown. AKI likely secondary to severe dehydration and rhabdomyolysis,.. Patient's daughter thinks he has not drank anything in several days. . Anion gap metabolic acidosis likely due to kidney failure. Cr 8.8, BUN 149 and GFR of 5.  Pt is anuric. Patient would need dialysis to correct these abnormalities given his anuria but is not a candidate due to poor prognosis. - con't to monitor - mIVF 165ml/hr NS. S/p 3.5L IVF bolus.  - am CMP  Hyperkalemia Secondary to renal failure.  Unable to give oral K binding agents such as lokelma or kayexolate given his mental state.   - pm bmp - consider calcium gluconate for cardioprotection if elevated.  AG metabolic acidosis Due to presumed ketoacidosis secondary to dehydration and lactic acidosis   - pm bmp.   - morning cmp  Acute transaminitis  AST 113, ALT 143. PMH unknown. - am CMP  Concern for acute infarct  CT head showed hyperdense vessel in L MCA, chronic diffuse atrophy, small vessel ischemic change. Discussed with family that in the context of poor prognosis and being out of the therapeutic window for thrombolysis an MRI would not change management.  Family agreed to forego MRI.  - re-evaluate for stroke  if condition improves.  Cataracts and Glaucoma  Per chart review, patient diagnosed with nuclear sclerotic cataract of left eye, cortical age related cataract of L eye, primary open angle glaucoma of both eyes, presbyopia.  Followed by Dr. Venetia Maxon at [facility] medication include combigan, rhopressa, and restasis, and lumigan eye drops   FEN/GI: NPO Prophylaxis: None  Disposition: Med-surg   History of Present  Illness:  Matthew Winters is a 84 y.o. male presenting with altered mental status after a fall on 10/8. Patient was found on the ground today by brother and daughter. Patient is currently not verbally responsive and only responsive to painful stimuli. History provided by daughter Joycelyn Schmid who is at bedside. Patient lives with his brother. He fell last night and brother was unable to pick him up, and he stayed on the ground until this morning.  He was speaking initially but stopped responding. Last time daughter saw him prior to this week was two months ago and he was walking and eating.  When she saw him Wednesday she asked him if he had been eating, and he endorsed eating but daughter believes he was laying in bed all day.  She brought him something to eat and when she went to see patient today( 3 days later) after the fall she noticed the food was still there. Margeret states that he does not recognize her, and does not realize he lives with his brother.    Daughter unsure of medical history or medications patient is taking. States she only knows about his poor eyesight and him seeing an eye doctor. Daughter reports she hasnt talked to patient in a couple months because he is not able to answer the phone due to his dementia  Patient has 3 daughters who all live in [place]. Youngest daughter Kieth Brightly sees patient nearly every morning on her way to work according to [place]. Penny's number: [phone]     Review Of Systems: Unable to perform due to nonverbal patient   Review of Systems   Patient Active Problem List   Diagnosis Date Noted  . Abnormal liver enzymes   . Loss of appetite   . AKI (acute kidney injury) ([place]) 07/09/2018  . Dehydration 07/09/2018  . Adynamic ileus ([place]) 07/09/2018  . Essential hypertension 07/09/2018  . CKD (chronic kidney disease), stage III ([place]) 07/09/2018  . Acute urinary retention 07/09/2018    Past Medical History: Past Medical History:  Diagnosis Date  .  Arthritis   . Cataract   . Glaucoma   . Gout   . History of blood transfusion   . Hyperlipidemia   . Hypertension     Past Surgical History: Past Surgical History:  Procedure Laterality Date  . CATARACT EXTRACTION W/PHACO  07/27/2012   Procedure: CATARACT EXTRACTION PHACO AND INTRAOCULAR LENS PLACEMENT (IOC);  Surgeon: Marylynn Pearson, MD;  Location: [facility];  Service: Ophthalmology;  Laterality: Right;  . COLONOSCOPY    . COLONOSCOPY W/ POLYPECTOMY    . EYE SURGERY  2012   glaucoma surgery - right  . MULTIPLE EXTRACTIONS WITH ALVEOLOPLASTY N/A 10/16/2013   Procedure: MULTIPLE EXTRACION WITH ALVEOLOPLASTY;  Surgeon: Gae Bon, DDS;  Location: [facility];  Service: Oral Surgery;  Laterality: N/A;  . POLYPECTOMY      Social History: Social History   Tobacco Use  . Smoking status: Former Research scientist (life sciences)  . Smokeless tobacco: Never Used  Vaping Use  . Vaping Use: Never used  Substance Use Topics  . Alcohol use: No  Alcohol/week: 0.0 standard drinks  . Drug use: No   Additional social history: Not performed   Please also refer to relevant sections of EMR.  Family History: Family History  Problem Relation Age of Onset  . Heart disease Mother   . Colon cancer Neg Hx   . Colon polyps Neg Hx   . Rectal cancer Neg Hx   . Stomach cancer Neg Hx     Allergies and Medications: No Known Allergies No current facility-administered medications on file prior to encounter.   Current Outpatient Medications on File Prior to Encounter  Medication Sig Dispense Refill  . AZOPT 1 % ophthalmic suspension Place 1 drop into both eyes 3 (three) times daily.  6  . bimatoprost (LUMIGAN) 0.03 % ophthalmic solution Place 1 drop into both eyes at bedtime.    . colchicine 0.6 MG tablet Take 0.6 mg by mouth daily as needed (gout flares).     . feeding supplement, ENSURE ENLIVE, (ENSURE ENLIVE) LIQD Take 237 mLs by mouth 3 (three) times daily between meals. 90 Bottle 0  . megestrol  (MEGACE) 400 MG/10ML suspension Take 10 mLs (400 mg total) by mouth daily. 240 mL 0  . metoCLOPramide (REGLAN) 10 MG tablet Take 1 tablet (10 mg total) by mouth 3 (three) times daily before meals for 4 days. 12 tablet 0  . ondansetron (ZOFRAN) 4 MG tablet Take 1 tablet (4 mg total) by mouth every 6 (six) hours as needed for nausea. Do Not take while taking metoclopramide. 20 tablet 0  . polyethylene glycol (MIRALAX / GLYCOLAX) packet Take 17 g by mouth daily. 14 each 0  . timolol (TIMOPTIC) 0.5 % ophthalmic solution Place 1 drop into both eyes daily.  4    Objective: BP 123/75   Pulse 85   Temp (!) 95.1 F (35.1 C)   Resp (!) 21   Ht 5\' 6"  (1.676 m)   Wt 80 kg   SpO2 99%   BMI 28.47 kg/m  Exam: General: In acute distress, ill appearing. Thin, frail elderly man appears older than stated age. Responsive to painful stimuli only Eyes: Normal conjuctiva. Pupils non reactive to light.right pupil larger than left.  Arcus senilis. Marland Kitchen  ENTM: mucous membranes dry  Neck: Supple Cardiovascular: RRR no murmurs Respiratory: CTAB Gastrointestinal: soft, no distention  MSK: No deformity  Derm: no rashes visualized  Neuro: Nonverbal. Upper extremity flexion to painful stimuli   Labs and Imaging: CBC BMET  Recent Labs  Lab  0954 09/13/2020 0954 09/08/2020 1009  WBC 11.8*  --   --   HGB 14.5   < > 16.0  HCT 44.3   < > 47.0  PLT 137*  --   --    < > = values in this interval not displayed.   Recent Labs  Lab 09/22/2020 0954 09/14/2020 0954 09/14/2020 1009  NA 147*   < > 144  K 5.9*   < > 5.6*  CL 107   < > 114*  CO2 14*  --   --   BUN 149*   < > >130*  CREATININE 8.80*   < > 9.20*  GLUCOSE 145*   < > 144*  CALCIUM 9.9  --   --    < > = values in this interval not displayed.     EKG: NSR   CT Head Wo Contrast  Result Date: 09/09/2020 CLINICAL DATA:  Status post multiple falls with intermitted altered mental status. EXAM: CT HEAD WITHOUT CONTRAST CT  CERVICAL SPINE WITHOUT  CONTRAST TECHNIQUE: Multidetector CT imaging of the head and cervical spine was performed following the standard protocol without intravenous contrast. Multiplanar CT image reconstructions of the cervical spine were also generated. COMPARISON:  August 10th 2019 FINDINGS: CT HEAD FINDINGS Brain: No evidence of acute infarction, hemorrhage, hydrocephalus, extra-axial collection or mass lesion/mass effect. There is chronic diffuse atrophy. Chronic bilateral periventricular white matter small vessel ischemic changes noted. Vascular: There is question hyperdense vessel in the left middle cerebral artery. Skull: Normal. Negative for fracture or focal lesion. Sinuses/Orbits: No acute finding. Other: None. CT CERVICAL SPINE FINDINGS Alignment: There is straightening of cervical spine. Skull base and vertebrae: No acute fracture. No primary bone lesion or focal pathologic process. Soft tissues and spinal canal: No prevertebral fluid or swelling. No visible canal hematoma. Disc levels: Degenerative joint changes are noted throughout the cervical spine with narrowed joint space and osteophyte formation. Upper chest: Emphysematous changes of the lung apices are noted. Other: None. IMPRESSION: 1. Question hyperdense vessel in the left middle cerebral artery. Recommend MRI of the head for further evaluation to exclude acute infarct. 2. No focal acute fracture or dislocation of cervical spine. 3. Chronic diffuse atrophy and chronic bilateral periventricular white matter small vessel ischemic change. These results were called by telephone at the time of interpretation on 09/20/2020 at 11:17 am to provider [facility] , who verbally acknowledged these results. Electronically Signed   By: Abelardo Diesel M.D.   On: 09/21/2020 11:20   CT Cervical Spine Wo Contrast  Result Date: 08/30/2020 CLINICAL DATA:  Status post multiple falls with intermitted altered mental status. EXAM: CT HEAD WITHOUT CONTRAST CT CERVICAL SPINE WITHOUT  CONTRAST TECHNIQUE: Multidetector CT imaging of the head and cervical spine was performed following the standard protocol without intravenous contrast. Multiplanar CT image reconstructions of the cervical spine were also generated. COMPARISON:  August 10th 2019 FINDINGS: CT HEAD FINDINGS Brain: No evidence of acute infarction, hemorrhage, hydrocephalus, extra-axial collection or mass lesion/mass effect. There is chronic diffuse atrophy. Chronic bilateral periventricular white matter small vessel ischemic changes noted. Vascular: There is question hyperdense vessel in the left middle cerebral artery. Skull: Normal. Negative for fracture or focal lesion. Sinuses/Orbits: No acute finding. Other: None. CT CERVICAL SPINE FINDINGS Alignment: There is straightening of cervical spine. Skull base and vertebrae: No acute fracture. No primary bone lesion or focal pathologic process. Soft tissues and spinal canal: No prevertebral fluid or swelling. No visible canal hematoma. Disc levels: Degenerative joint changes are noted throughout the cervical spine with narrowed joint space and osteophyte formation. Upper chest: Emphysematous changes of the lung apices are noted. Other: None. IMPRESSION: 1. Question hyperdense vessel in the left middle cerebral artery. Recommend MRI of the head for further evaluation to exclude acute infarct. 2. No focal acute fracture or dislocation of cervical spine. 3. Chronic diffuse atrophy and chronic bilateral periventricular white matter small vessel ischemic change. These results were called by telephone at the time of interpretation on 09/01/2020 at 11:17 am to provider [facility] , who verbally acknowledged these results. Electronically Signed   By: Abelardo Diesel M.D.   On: 09/16/2020 11:20   DG Pelvis Portable  Result Date: 09/01/2020 CLINICAL DATA:  Fall.  Weakness and altered mental status. EXAM: PORTABLE PELVIS 1-2 VIEWS COMPARISON:  None. FINDINGS: Lumbar spine degenerative changes.  Multiple pelvic phleboliths. No evidence for displaced fracture. Regional soft tissues unremarkable. IMPRESSION: No acute osseous abnormality. Electronically Signed   By: Polly Cobia.D.  On:  12:06   DG Chest Port 1 View  Result Date: 09/28/2020 CLINICAL DATA:  Follow-up.  Weakness. EXAM: PORTABLE CHEST 1 VIEW COMPARISON:  Chest radiograph 09/18/2018. FINDINGS: Monitoring leads overlie the patient. Stable cardiac and mediastinal contours. Tortuosity of the thoracic aorta. Low lung volumes. Bibasilar heterogeneous opacities. No pleural effusion or pneumothorax. IMPRESSION: Low lung volume exam with bibasilar opacities favored to represent atelectasis. Infection not excluded. Electronically Signed   By: Lovey Newcomer M.D.   On: 09/11/2020 12:04    Shary Key, DO 09/12/2020, 12:54 PM PGY-1, [place] Intern pager: [phone], text pages welcome

## 2020-09-07 NOTE — ED Notes (Signed)
Sister would like an update , pearlie- [phone]

## 2020-09-07 NOTE — Progress Notes (Signed)
   FMTS Attending Brief Note: Dorris Singh, MD  Personal pager:  [phone] [place] Service Pager:  [phone]   I  have seen and examined this patient, reviewed their chart. I have discussed this patient with the resident.  Patient seen at 1400. Will sign resident note as available.   84 year old man with dementia, CKD III, nonobstructive CAD, and remote history of GI bleed presenting with altered mental status, likely due to uremia in setting of significant acute kidney injury.   Unable to obtain history of patient, does respond to painful stimuli. Reviewed history and notes. See Dr. Richardson Landry detailed note of discussion with family. Had fall at home approximately 24 hours ago. Has had several weeks of progressive decline. Unclear timing of last known normal.   PMH, PSH, Social, family history limited to chart review.  Vitals notable for hypothermia which has improved with bair hugger.  Imaging- notable for left MCA hyperdense vessel  CXR negative for acute process EKG NSR with prolonged QT no peaked T waves Labs reviewed- notable for neutrophilia, thrombocytopenia, mild hypernatremia, kyperkalemia, marked elevation in creatinine and BUN, ALT/AST mildly elevated.  Marked anion gap acidosis  CK slightly elevated  Troponin is flat Lactate 3.2    1. Altered mental status, likely multifactorial, patient may have experienced stroke, given significant end organ damage to kidney, liver, family has elected to not pursue immediate therapy for this. Also contributing including acute kidney injury with uremia, possible infection. No hypercarbia or signs of seizure. EtOH negative.  2. Acute kidney injury in setting of known CKD III--aggressive IV fluids, strict I/O, urine lytes, bladder scan. Patient's baseline creatinine appears to be around 2.0. He has a history of urinary retention, suspect this may be ATN after prolonged dehydration. Aggressive IVF resuscitation. Appreciate Nephrology care and  consultation.  3. AGMA, due to elevated lactate and renal failure.  4. Sepsis, as possible cause of AMS. Collect blood and urine cultures, treat with Ceftriaxone. IVF as above.  5. Left MCA cutoff, concern for stroke, discussed with family further evaluation, they would like to address goals of care. Given multiorgan failure, current condition and baseline functional status, certainly reasonable. Patient made DNR. Palliative care consulted immediately after seeing patient this PM. Dr. Jeannine Kitten ultimately discussed specific plans for further management as outlined in his note. Could give rectal aspirin even though NPO.    Remainder per resident note as it is available.

## 2020-09-07 NOTE — ED Notes (Signed)
c spine cleared by Dr Francia Greaves, c collar removed

## 2020-09-07 NOTE — Progress Notes (Addendum)
Per On Call MD,Patient will be DNR,family would not pursuing aggressive care for him and will meet palliative tomorrow morning.

## 2020-09-07 NOTE — ED Provider Notes (Signed)
[facility] EMERGENCY DEPARTMENT Provider Note   CSN: [id number] Arrival date & time:   0349     History Chief Complaint  Patient presents with  . Altered Mental Status  . Fall    Matthew Winters is a 84 y.o. male.  84 year old patient is unable to provide history.  History is obtained from the patient's daughter and EMS.  Patient apparently lives with an elderly brother.  Patient with gradual decline and increasing confusion over the last several days.  Patient apparently fell to the floor late yesterday evening and was unable to be lifted.  He spent the night on the floor until being found by family this morning.  Patient is not responsive to verbal stimulation.  He withdraws all 4 extremities to painful stimulation.  He appears to be protecting his airway.  Patient's daughter is at bedside.  She reports gradually progressive symptoms of dementia.  Patient with decreased p.o. intake over the last several days.  Patient's daughter is concerned that the patient's dementia is becoming worse.  The patient does not have an established POA.  Level 5 caveat secondary to patient's dementia and condition.  The history is provided by the patient, medical records and the EMS personnel.  Altered Mental Status Presenting symptoms: lethargy and partial responsiveness   Severity:  Severe Most recent episode:  More than 2 days ago Episode history:  Single Timing:  Constant Progression:  Worsening Chronicity:  New Fall       Past Medical History:  Diagnosis Date  . Arthritis   . Cataract   . Glaucoma   . Gout   . History of blood transfusion   . Hyperlipidemia   . Hypertension     Patient Active Problem List   Diagnosis Date Noted  . Abnormal liver enzymes   . Loss of appetite   . AKI (acute kidney injury) ([place]) 07/09/2018  . Dehydration 07/09/2018  . Adynamic ileus ([place]) 07/09/2018  . Essential hypertension 07/09/2018  . CKD (chronic kidney disease),  stage III ([place]) 07/09/2018  . Acute urinary retention 07/09/2018    Past Surgical History:  Procedure Laterality Date  . CATARACT EXTRACTION W/PHACO  07/27/2012   Procedure: CATARACT EXTRACTION PHACO AND INTRAOCULAR LENS PLACEMENT (IOC);  Surgeon: Marylynn Pearson, MD;  Location: [facility];  Service: Ophthalmology;  Laterality: Right;  . COLONOSCOPY    . COLONOSCOPY W/ POLYPECTOMY    . EYE SURGERY  2012   glaucoma surgery - right  . MULTIPLE EXTRACTIONS WITH ALVEOLOPLASTY N/A 10/16/2013   Procedure: MULTIPLE EXTRACION WITH ALVEOLOPLASTY;  Surgeon: Gae Bon, DDS;  Location: [facility];  Service: Oral Surgery;  Laterality: N/A;  . POLYPECTOMY         Family History  Problem Relation Age of Onset  . Heart disease Mother   . Colon cancer Neg Hx   . Colon polyps Neg Hx   . Rectal cancer Neg Hx   . Stomach cancer Neg Hx     Social History   Tobacco Use  . Smoking status: Former Research scientist (life sciences)  . Smokeless tobacco: Never Used  Vaping Use  . Vaping Use: Never used  Substance Use Topics  . Alcohol use: No    Alcohol/week: 0.0 standard drinks  . Drug use: No    Home Medications Prior to Admission medications   Medication Sig Start Date End Date Taking? Authorizing Provider  AZOPT 1 % ophthalmic suspension Place 1 drop into both eyes 3 (three) times daily. 07/01/18  [provider]  bimatoprost (LUMIGAN) 0.03 % ophthalmic solution Place 1 drop into both eyes at bedtime.    [provider]  colchicine 0.6 MG tablet Take 0.6 mg by mouth daily as needed (gout flares).     [provider]  feeding supplement, ENSURE ENLIVE, (ENSURE ENLIVE) LIQD Take 237 mLs by mouth 3 (three) times daily between meals. 07/16/18   Lavina Hamman, MD  megestrol (MEGACE) 400 MG/10ML suspension Take 10 mLs (400 mg total) by mouth daily. 07/17/18   Lavina Hamman, MD  metoCLOPramide (REGLAN) 10 MG tablet Take 1 tablet (10 mg total) by mouth 3 (three) times daily before meals  for 4 days. 07/16/18 07/20/18  Lavina Hamman, MD  ondansetron (ZOFRAN) 4 MG tablet Take 1 tablet (4 mg total) by mouth every 6 (six) hours as needed for nausea. Do Not take while taking metoclopramide. 07/20/18   Lavina Hamman, MD  polyethylene glycol [facility] / Floria Raveling) packet Take 17 g by mouth daily. 07/17/18   Lavina Hamman, MD  timolol (TIMOPTIC) 0.5 % ophthalmic solution Place 1 drop into both eyes daily. 07/01/18   [provider]    Allergies    Patient has no known allergies.  Review of Systems   Review of Systems  Unable to perform ROS: Acuity of condition    Physical Exam Updated Vital Signs BP 123/75   Pulse 85   Temp (!) 95.1 F (35.1 C)   Resp (!) 21   Ht 5\' 6"  (1.676 m)   Wt 80 kg   SpO2 99%   BMI 28.47 kg/m   Physical Exam Vitals and nursing note reviewed.  Constitutional:      General: He is in acute distress.     Appearance: He is well-developed. He is ill-appearing.     Comments: Dehydrated, nonverbal, withdraws to pain in all 4 extremities, protecting airway   HENT:     Head: Normocephalic and atraumatic.     Mouth/Throat:     Mouth: Mucous membranes are dry.  Eyes:     Conjunctiva/sclera: Conjunctivae normal.     Pupils: Pupils are equal, round, and reactive to light.  Cardiovascular:     Rate and Rhythm: Normal rate and regular rhythm.     Heart sounds: Normal heart sounds.  Pulmonary:     Effort: Pulmonary effort is normal. No respiratory distress.     Breath sounds: Normal breath sounds.  Abdominal:     General: There is no distension.     Palpations: Abdomen is soft.     Tenderness: There is no abdominal tenderness.  Musculoskeletal:        General: No deformity. Normal range of motion.     Cervical back: Normal range of motion and neck supple.  Skin:    General: Skin is dry.     Comments: Cool, dry  Neurological:     General: No focal deficit present.     Mental Status: He is alert.     Comments: Non-verbal  Withdraws to  painful stimulation in all 4 extremities      ED Results / Procedures / Treatments   Labs (all labs ordered are listed, but only abnormal results are displayed) Labs Reviewed  COMPREHENSIVE METABOLIC PANEL - Abnormal; Notable for the following components:      Result Value   Sodium 147 (*)    Potassium 5.9 (*)    CO2 14 (*)    Glucose, Bld 145 (*)  BUN 149 (*)    Creatinine, Ser 8.80 (*)    AST 113 (*)    ALT 143 (*)    Total Bilirubin 1.7 (*)    GFR, Estimated 5 (*)    Anion gap 26 (*)    All other components within normal limits  LACTIC ACID, PLASMA - Abnormal; Notable for the following components:   Lactic Acid, Venous 3.6 (*)    All other components within normal limits  CBC WITH DIFFERENTIAL/PLATELET - Abnormal; Notable for the following components:   WBC 11.8 (*)    RDW 16.0 (*)    Platelets 137 (*)    nRBC 1.0 (*)    Neutro Abs 9.5 (*)    Abs Immature Granulocytes 0.33 (*)    All other components within normal limits  PROTIME-INR - Abnormal; Notable for the following components:   Prothrombin Time 15.4 (*)    INR 1.3 (*)    All other components within normal limits  CK - Abnormal; Notable for the following components:   Total CK 657 (*)    All other components within normal limits  CBG MONITORING, ED - Abnormal; Notable for the following components:   Glucose-Capillary 113 (*)    All other components within normal limits  I-STAT CHEM 8, ED - Abnormal; Notable for the following components:   Potassium 5.6 (*)    Chloride 114 (*)    BUN >130 (*)    Creatinine, Ser 9.20 (*)    Glucose, Bld 144 (*)    Calcium, Ion 1.05 (*)    TCO2 15 (*)    All other components within normal limits  I-STAT ARTERIAL BLOOD GAS, ED - Abnormal; Notable for the following components:   pCO2 arterial 23.0 (*)    pO2, Arterial 163 (*)    Bicarbonate 15.1 (*)    TCO2 16 (*)    Acid-base deficit 8.0 (*)    Potassium 5.7 (*)    Calcium, Ion 1.12 (*)    All other components within  normal limits  TROPONIN I (HIGH SENSITIVITY) - Abnormal; Notable for the following components:   Troponin I (High Sensitivity) 90 (*)    All other components within normal limits  RESPIRATORY PANEL BY RT PCR (FLU A&B, COVID)  ETHANOL  AMMONIA  URINALYSIS, ROUTINE W REFLEX MICROSCOPIC  LACTIC ACID, PLASMA  RAPID URINE DRUG SCREEN, HOSP PERFORMED  CBG MONITORING, ED  TYPE AND SCREEN  ABO/RH  TROPONIN I (HIGH SENSITIVITY)    EKG EKG Interpretation  Date/Time:  Saturday September 07 2020 09:43:53 EDT Ventricular Rate:  85 PR Interval:    QRS Duration: 102 QT Interval:  419 QTC Calculation: 499 R Axis:   6 Text Interpretation: Sinus rhythm Aberrant conduction of SV complex(es) Short PR interval Borderline prolonged QT interval Confirmed by Dene Gentry [phone]) on 09/25/2020 9:52:09 AM   Radiology CT Head Wo Contrast  Result Date: 09/11/2020 CLINICAL DATA:  Status post multiple falls with intermitted altered mental status. EXAM: CT HEAD WITHOUT CONTRAST CT CERVICAL SPINE WITHOUT CONTRAST TECHNIQUE: Multidetector CT imaging of the head and cervical spine was performed following the standard protocol without intravenous contrast. Multiplanar CT image reconstructions of the cervical spine were also generated. COMPARISON:  August 10th 2019 FINDINGS: CT HEAD FINDINGS Brain: No evidence of acute infarction, hemorrhage, hydrocephalus, extra-axial collection or mass lesion/mass effect. There is chronic diffuse atrophy. Chronic bilateral periventricular white matter small vessel ischemic changes noted. Vascular: There is question hyperdense vessel in the left middle cerebral artery.  Skull: Normal. Negative for fracture or focal lesion. Sinuses/Orbits: No acute finding. Other: None. CT CERVICAL SPINE FINDINGS Alignment: There is straightening of cervical spine. Skull base and vertebrae: No acute fracture. No primary bone lesion or focal pathologic process. Soft tissues and spinal canal: No  prevertebral fluid or swelling. No visible canal hematoma. Disc levels: Degenerative joint changes are noted throughout the cervical spine with narrowed joint space and osteophyte formation. Upper chest: Emphysematous changes of the lung apices are noted. Other: None. IMPRESSION: 1. Question hyperdense vessel in the left middle cerebral artery. Recommend MRI of the head for further evaluation to exclude acute infarct. 2. No focal acute fracture or dislocation of cervical spine. 3. Chronic diffuse atrophy and chronic bilateral periventricular white matter small vessel ischemic change. These results were called by telephone at the time of interpretation on 09/25/2020 at 11:17 am to provider [facility] , who verbally acknowledged these results. Electronically Signed   By: Abelardo Diesel M.D.   On: 09/16/2020 11:20   CT Cervical Spine Wo Contrast  Result Date: 09/15/2020 CLINICAL DATA:  Status post multiple falls with intermitted altered mental status. EXAM: CT HEAD WITHOUT CONTRAST CT CERVICAL SPINE WITHOUT CONTRAST TECHNIQUE: Multidetector CT imaging of the head and cervical spine was performed following the standard protocol without intravenous contrast. Multiplanar CT image reconstructions of the cervical spine were also generated. COMPARISON:  August 10th 2019 FINDINGS: CT HEAD FINDINGS Brain: No evidence of acute infarction, hemorrhage, hydrocephalus, extra-axial collection or mass lesion/mass effect. There is chronic diffuse atrophy. Chronic bilateral periventricular white matter small vessel ischemic changes noted. Vascular: There is question hyperdense vessel in the left middle cerebral artery. Skull: Normal. Negative for fracture or focal lesion. Sinuses/Orbits: No acute finding. Other: None. CT CERVICAL SPINE FINDINGS Alignment: There is straightening of cervical spine. Skull base and vertebrae: No acute fracture. No primary bone lesion or focal pathologic process. Soft tissues and spinal canal: No  prevertebral fluid or swelling. No visible canal hematoma. Disc levels: Degenerative joint changes are noted throughout the cervical spine with narrowed joint space and osteophyte formation. Upper chest: Emphysematous changes of the lung apices are noted. Other: None. IMPRESSION: 1. Question hyperdense vessel in the left middle cerebral artery. Recommend MRI of the head for further evaluation to exclude acute infarct. 2. No focal acute fracture or dislocation of cervical spine. 3. Chronic diffuse atrophy and chronic bilateral periventricular white matter small vessel ischemic change. These results were called by telephone at the time of interpretation on 09/06/2020 at 11:17 am to provider [facility] , who verbally acknowledged these results. Electronically Signed   By: Abelardo Diesel M.D.   On: 09/23/2020 11:20   DG Pelvis Portable  Result Date: 09/28/2020 CLINICAL DATA:  Fall.  Weakness and altered mental status. EXAM: PORTABLE PELVIS 1-2 VIEWS COMPARISON:  None. FINDINGS: Lumbar spine degenerative changes. Multiple pelvic phleboliths. No evidence for displaced fracture. Regional soft tissues unremarkable. IMPRESSION: No acute osseous abnormality. Electronically Signed   By: Lovey Newcomer M.D.   On: 09/20/2020 12:06   DG Chest Port 1 View  Result Date: 09/11/2020 CLINICAL DATA:  Follow-up.  Weakness. EXAM: PORTABLE CHEST 1 VIEW COMPARISON:  Chest radiograph 09/18/2018. FINDINGS: Monitoring leads overlie the patient. Stable cardiac and mediastinal contours. Tortuosity of the thoracic aorta. Low lung volumes. Bibasilar heterogeneous opacities. No pleural effusion or pneumothorax. IMPRESSION: Low lung volume exam with bibasilar opacities favored to represent atelectasis. Infection not excluded. Electronically Signed   By: Polly Cobia.D.  On: 09/17/2020 12:04    Procedures Procedures (including critical care time)  CRITICAL CARE Performed by: Valarie Merino   Total critical care time: 45  minutes  Critical care time was exclusive of separately billable procedures and treating other patients.  Critical care was necessary to treat or prevent imminent or life-threatening deterioration.  Critical care was time spent personally by me on the following activities: development of treatment plan with patient and/or surrogate as well as nursing, discussions with consultants, evaluation of patient's response to treatment, examination of patient, obtaining history from patient or surrogate, ordering and performing treatments and interventions, ordering and review of laboratory studies, ordering and review of radiographic studies, pulse oximetry and re-evaluation of patient's condition.   Medications Ordered in ED Medications  sodium chloride 0.9 % bolus 1,000 mL (0 mLs Intravenous Stopped 09/25/2020 1125)    ED Course  I have reviewed the triage vital signs and the nursing notes.  Pertinent labs & imaging results that were available during my care of the patient were reviewed by me and considered in my medical decision making (see chart for details).    MDM Rules/Calculators/A&P                          MDM  Screen complete  Matthew Winters was evaluated in Emergency Department on 09/08/2020 for the symptoms described in the history of present illness. He was evaluated in the context of the global COVID-19 pandemic, which necessitated consideration that the patient might be at risk for infection with the SARS-CoV-2 virus that causes COVID-19. Institutional protocols and algorithms that pertain to the evaluation of patients at risk for COVID-19 are in a state of rapid change based on information released by regulatory bodies including the CDC and federal and state organizations. These policies and algorithms were followed during the patient's care in the ED.  Patient is presenting for evaluation of AMS.  Patient with objective findings of significant dehydration and renal  failure.  Patient's history is significant for gradually progressive symptoms of dementia.  Dr. Jonnie Finner of nephrology is aware case and will consult.  Family medicine team is aware case and will evaluate for admission.  Final Clinical Impression(s) / ED Diagnoses Final diagnoses:  Dehydration  Renal failure, unspecified chronicity    Rx / DC Orders ED Discharge Orders    None       Valarie Merino, MD 09/03/2020 1303

## 2020-09-07 NOTE — ED Notes (Signed)
bair hugger applied.

## 2020-09-07 NOTE — Progress Notes (Signed)
Spoke with patient's daughter, Joycelyn Schmid, as well as other daughter Kieth Brightly.  At the time I spoke with them neither had spoken with palliative care.  They said a family meeting was scheduled for tomorrow.  We discussed the patient's CODE STATUS and Joycelyn Schmid agreed to make him a DNR.  I told her I believe this is the right choice as he is unlikely to survive any attempt at resuscitation.  I discussed with her the findings on the CT scan, specifically the hyperdense vessel in the left middle cerebral artery and the suggested MRI to further evaluate.  I told her that if this was an acute stroke, he is outside of the therapeutic window and would also not be a suitable candidate for interventions.  Informed her that treatment at this point would involved dual antiplatelet therapy and that the patient is unable to swallow at this time.  MRI would not change our management in the situation and patient's daughter was in agreement to forego the MRI.  We have decided to continue treating with IV fluid hesitation and antibiotics for presumed infection until the family comes to a decision tomorrow regarding goals of care.  Clemetine Marker, MD Family medicine residency, PGY 3

## 2020-09-08 DIAGNOSIS — N183 Chronic kidney disease, stage 3 unspecified: Secondary | ICD-10-CM | POA: Diagnosis not present

## 2020-09-08 DIAGNOSIS — Z66 Do not resuscitate: Secondary | ICD-10-CM | POA: Diagnosis not present

## 2020-09-08 DIAGNOSIS — Z789 Other specified health status: Secondary | ICD-10-CM

## 2020-09-08 DIAGNOSIS — I639 Cerebral infarction, unspecified: Secondary | ICD-10-CM

## 2020-09-08 DIAGNOSIS — R531 Weakness: Secondary | ICD-10-CM

## 2020-09-08 DIAGNOSIS — N179 Acute kidney failure, unspecified: Secondary | ICD-10-CM | POA: Diagnosis not present

## 2020-09-08 DIAGNOSIS — Z515 Encounter for palliative care: Secondary | ICD-10-CM

## 2020-09-08 LAB — COMPREHENSIVE METABOLIC PANEL
ALT: 110 U/L — ABNORMAL HIGH (ref 0–44)
ALT: 102 U/L — ABNORMAL HIGH (ref 0–44)
AST: 76 U/L — ABNORMAL HIGH (ref 15–41)
AST: 61 U/L — ABNORMAL HIGH (ref 15–41)
Albumin: 3 g/dL — ABNORMAL LOW (ref 3.5–5.0)
Albumin: 3.1 g/dL — ABNORMAL LOW (ref 3.5–5.0)
Alkaline Phosphatase: 43 U/L (ref 38–126)
Alkaline Phosphatase: 47 U/L (ref 38–126)
Anion gap: 21 — ABNORMAL HIGH (ref 5–15)
Anion gap: 22 — ABNORMAL HIGH (ref 5–15)
BUN: 149 mg/dL — ABNORMAL HIGH (ref 8–23)
BUN: 146 mg/dL — ABNORMAL HIGH (ref 8–23)
CO2: 12 mmol/L — ABNORMAL LOW (ref 22–32)
CO2: 10 mmol/L — ABNORMAL LOW (ref 22–32)
Calcium: 8.3 mg/dL — ABNORMAL LOW (ref 8.9–10.3)
Calcium: 8.1 mg/dL — ABNORMAL LOW (ref 8.9–10.3)
Chloride: 115 mmol/L — ABNORMAL HIGH (ref 98–111)
Chloride: 117 mmol/L — ABNORMAL HIGH (ref 98–111)
Creatinine, Ser: 7.39 mg/dL — ABNORMAL HIGH (ref 0.61–1.24)
Creatinine, Ser: 7.16 mg/dL — ABNORMAL HIGH (ref 0.61–1.24)
GFR, Estimated: 6 mL/min — ABNORMAL LOW (ref >60)
GFR, Estimated: 6 mL/min — ABNORMAL LOW (ref >60)
Glucose, Bld: 124 mg/dL — ABNORMAL HIGH (ref 70–99)
Glucose, Bld: 118 mg/dL — ABNORMAL HIGH (ref 70–99)
Potassium: 5.6 mmol/L — ABNORMAL HIGH (ref 3.5–5.1)
Potassium: 5.8 mmol/L — ABNORMAL HIGH (ref 3.5–5.1)
Sodium: 148 mmol/L — ABNORMAL HIGH (ref 135–145)
Sodium: 149 mmol/L — ABNORMAL HIGH (ref 135–145)
Total Bilirubin: 1.1 mg/dL (ref 0.3–1.2)
Total Bilirubin: 1 mg/dL (ref 0.3–1.2)
Total Protein: 6.6 g/dL (ref 6.5–8.1)
Total Protein: 6.6 g/dL (ref 6.5–8.1)

## 2020-09-08 LAB — CBC
HCT: 40.1 % (ref 39.0–52.0)
Hemoglobin: 12.9 g/dL — ABNORMAL LOW (ref 13.0–17.0)
MCH: 27.9 pg (ref 26.0–34.0)
MCHC: 32.2 g/dL (ref 30.0–36.0)
MCV: 86.8 fL (ref 80.0–100.0)
Platelets: 112 K/uL — ABNORMAL LOW (ref 150–400)
RBC: 4.62 MIL/uL (ref 4.22–5.81)
RDW: 15.9 % — ABNORMAL HIGH (ref 11.5–15.5)
WBC: 12.2 K/uL — ABNORMAL HIGH (ref 4.0–10.5)
nRBC: 0.5 % — ABNORMAL HIGH (ref 0.0–0.2)

## 2020-09-08 LAB — PROTIME-INR
INR: 1.3 — ABNORMAL HIGH (ref 0.8–1.2)
Prothrombin Time: 15.6 seconds — ABNORMAL HIGH (ref 11.4–15.2)

## 2020-09-08 MED ORDER — GLYCOPYRROLATE 1 MG PO TABS
1.0000 mg | ORAL_TABLET | ORAL | Status: DC | PRN
  Filled 2020-09-08: qty 1

## 2020-09-08 MED ORDER — BIOTENE DRY MOUTH MT LIQD: 15.0000 mL | OROMUCOSAL | Status: DC | PRN

## 2020-09-08 MED ORDER — GLYCOPYRROLATE 0.2 MG/ML IJ SOLN: 0.2000 mg | INTRAMUSCULAR | Status: DC | PRN

## 2020-09-08 MED ORDER — OXYCODONE HCL 20 MG/ML PO CONC: 5.0000 mg | ORAL | Status: DC | PRN

## 2020-09-08 MED ORDER — CHLORHEXIDINE GLUCONATE CLOTH 2 % EX PADS
6.0000 | MEDICATED_PAD | Freq: Every day | CUTANEOUS | Status: DC
  Administered 2020-09-08 – 2020-09-10 (×3): 6 via TOPICAL

## 2020-09-08 MED ORDER — LORAZEPAM 2 MG/ML IJ SOLN
1.0000 mg | INTRAMUSCULAR | Status: DC | PRN
  Administered 2020-09-09 – 2020-09-10 (×3): 1 mg via INTRAVENOUS
  Filled 2020-09-08 (×3): qty 1

## 2020-09-08 MED ORDER — HALOPERIDOL 0.5 MG PO TABS
0.5000 mg | ORAL_TABLET | ORAL | Status: DC | PRN
  Filled 2020-09-08: qty 1

## 2020-09-08 MED ORDER — HALOPERIDOL LACTATE 5 MG/ML IJ SOLN
0.5000 mg | INTRAMUSCULAR | Status: DC | PRN
  Administered 2020-09-10: 0.5 mg via INTRAVENOUS
  Filled 2020-09-08: qty 1

## 2020-09-08 MED ORDER — LORAZEPAM 2 MG/ML PO CONC: 1.0000 mg | ORAL | Status: DC | PRN

## 2020-09-08 MED ORDER — LORAZEPAM 1 MG PO TABS
1.0000 mg | ORAL_TABLET | ORAL | Status: DC | PRN
  Filled 2020-09-08: qty 1

## 2020-09-08 MED ORDER — ONDANSETRON HCL 4 MG/2ML IJ SOLN: 4.0000 mg | Freq: Four times a day (QID) | INTRAMUSCULAR | Status: DC | PRN

## 2020-09-08 MED ORDER — ONDANSETRON 4 MG PO TBDP: 4.0000 mg | ORAL_TABLET | Freq: Four times a day (QID) | ORAL | Status: DC | PRN

## 2020-09-08 MED ORDER — HALOPERIDOL LACTATE 2 MG/ML PO CONC
0.5000 mg | ORAL | Status: DC | PRN
  Filled 2020-09-08: qty 0.3

## 2020-09-08 MED ORDER — POLYVINYL ALCOHOL 1.4 % OP SOLN
1.0000 [drp] | Freq: Four times a day (QID) | OPHTHALMIC | Status: DC | PRN
  Filled 2020-09-08: qty 15

## 2020-09-08 NOTE — Progress Notes (Signed)
FMTS Brief Note   Met with family Joycelyn Schmid, [place], and [place]) with NP Ronal Fear.  Discussed current medical conditions, prior functional ability and progressive decline since March 2021.  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  He previously was very active.  He is now living with his brother and has had a progressive decline with change in his gait and his ability to communicate well over the past several months.  After discussing findings from CT (which include small vessel ischemic changes that are chronic as well as a possible left MCA occlusion, which does correlate with his some of his findings on examination) and acute renal failure with significant electrolyte disarray, the family wishes to pursue comfort measures.  They would like to discontinue IV fluids and focus on the patient's comfort.  Communicated with Dr.Brimage regarding changes and focus on comfort. Will update plan of care/orders as appropriate.   Dorris Singh, MD  Family Medicine Teaching Service

## 2020-09-08 NOTE — Progress Notes (Signed)
Daily Progress Note   Patient Name: Matthew Winters       Date: 09/08/2020 DOB: [birth year]  Age: 84 y.o. MRN#: [id number] Attending Physician: Martyn Malay, MD Primary Care Physician: [name], MD Admit Date: 09/16/2020  Reason for Consultation/Follow-up: Establishing goals of care  Subjective: Met with patient's 3 daughters- Leighton Roach and Otis Peak. Dr. Dorris Singh was also kindly in attendance (attending provider from [facility]).  Patient is widowed, has had significant decline since spouse died. Most recently was living with brother but noted to have difficulty ambulating. Has lost a great deal of weight in the last few months. He was scheduled to have some type of eye surgery- however, this was cancelled due to patient's mental status not being well enough.  Reviewed patient's current illness, comorbidities and possible trajectories.  He has likely had acute stroke in the setting of what sounds like pretty advanced dementia. This has affected his ability to speak, eat and drink.  Additionally, has renal disease that may temporarily improve with IV hydration, however, will again decline once IV fluids are stopped due to patient not eating or drinking. He is also not a candidate for hemodialysis.  We discussed options of continued aggressive medical care vs transition to full comfort measures (which includes stopping IV fluids and other life prolonging medications, stopping lab draws, providing symptom management for pain, nausea, anxiety or any other signs of discomfort.) Hospice services were discussed. Patient is appropriate for residential hospice and family would like to request placement at [facility].    Review of Systems  Unable to perform ROS: Mental acuity    Length  of Stay: 1  Current Medications: Scheduled Meds:  . Chlorhexidine Gluconate Cloth  6 each Topical Daily    Continuous Infusions:   PRN Meds: acetaminophen **OR** acetaminophen  Physical Exam Vitals and nursing note reviewed.  Constitutional:      Appearance: He is ill-appearing.     Comments: frail  Pulmonary:     Effort: Pulmonary effort is normal.  Skin:    General: Skin is warm and dry.  Neurological:     Comments: Nonverbal             Vital Signs: BP 113/90 (BP Location: Right Arm)   Pulse 80   Temp 97.6 F (36.4 C) (Oral)  Resp 20   Ht _0  (1.676 m)   Wt 55.8 kg   SpO2 100%   BMI 19.86 kg/m  SpO2: SpO2: 100 % O2 Device: O2 Device: Room Air O2 Flow Rate: O2 Flow Rate (L/min): 2 L/min  Intake/output summary:   Intake/Output Summary (Last 24 hours) at 09/08/2020 1522 Last data filed at 09/08/2020 1000 Gross per 24 hour  Intake 3780.73 ml  Output 2200 ml  Net 1580.73 ml   LBM: Last BM Date:  (PTA.) Baseline Weight: Weight: 80 kg Most recent weight: Weight: 55.8 kg       Palliative Assessment/Data: PPS: 10%      Patient Active Problem List   Diagnosis Date Noted  . Acute renal failure superimposed on stage 3 chronic kidney disease ([place])   . Palliative care by specialist   . Generalized weakness   . DNR (do not resuscitate)   . DNI (do not intubate)   . Sepsis ([place]) 09/08/2020  . Abnormal liver enzymes   . Loss of appetite   . AKI (acute kidney injury) ([place]) 07/09/2018  . Dehydration 07/09/2018  . Adynamic ileus ([place]) 07/09/2018  . Essential hypertension 07/09/2018  . CKD (chronic kidney disease), stage III ([place]) 07/09/2018  . Acute urinary retention 07/09/2018    Palliative Care Assessment & Plan   Patient Profile: 84 y.o. male  with past medical history of hypertension, hyperlipidemia, and advanced dementia was evaluated in the ED on 09/22/2020 for AMS and multiple falls at home. Patient's history is significant for gradually  worsening progressive symptoms of dementia. Admitting providers were consulted on 09/04/2020 for AMS s/p fall, AKI, acute transaminitis, and concern for acute infarct.  Assessment/Recommendations/Plan   Transition to full comfort measures only  D/C orders and interventions that are not necessary for comfort  Comfort medications as ordered  Goals of Care and Additional Recommendations:  Limitations on Scope of Treatment: Full Comfort Care  Code Status:  DNR  Prognosis:   < 2 weeks due to acute stroke, no po intake, in the setting of renal failure- not an HD candidate- no plans for further aggressive care  Discharge Planning:  Hospice facility  Care plan was discussed with patient's daughters and Dr. Owens Shark.   Thank you for allowing the Palliative Medicine Team to assist in the care of this patient.   Total time: 122 minutes  Greater than 50%  of this time was spent counseling and coordinating care related to the above assessment and plan.  Mariana Kaufman, AGNP-C Palliative Medicine   Please contact Palliative Medicine Team phone at [phone] for questions and concerns.

## 2020-09-08 NOTE — Progress Notes (Signed)
[facility] Progress Note  Subjective: seen in room, no verbalization, looks a little better  Vitals:   09/08/20 0500 09/08/20 1037 09/08/20 1059 09/08/20 1106  BP:  113/90    Pulse:  80 (!) 106 80  Resp:  20    Temp:  97.6 F (36.4 C)    TempSrc:  Oral    SpO2:  (!) 69% (!) 69% 100%  Weight: 55.8 kg     Height:        Exam: Gen slightly awake today, minimal verbal responses Poor skin turgor No jvd or bruits Chest clear bilat to base, poor air movement, no bronch bs RRR no MRG Abd soft ntnd no mass or ascites +bs GU normal male w/ foley in place MS no joint effusions or deformity Ext no edema, no wounds or ulcers Neuro is lethargic but intermittently awake    Home meds:  - megace 400 qd/ reglan 10 qid x 4 days/ zofran prn  - miralax prn  - eye drops x 4  - prn's/ vitamins/ supplements    Date               Creat               eGFR (ml/min)/ other   2009              3.6  2013               2.17                 33  2014               2.50  06/2018            3.4 >> 1.82      AKI episode, 18 >> 39 ml/min  08/2018          2.63                 25   09/28/2020          8.80                 5                Last UA from aug 2019 - neg protein, 0-5 rbc/ epi, no bact    UA 100 prot, > 50 rbc/ wbc    UNa 67, UCr 118     CXR - IMPRESSION: Low lung volume exam with bibasilar  opacities favored to represent atelectasis. Infection not excluded.  Assessment/ Plan: 1. AoCKD 4 - b/l creat 1.8- 2.6 from 2019, eGFR 25-39. Here w/ wt loss, confusion, creat 8.80 > 7.5 > 7.1 today w/ IVF"s. Kidneys look rough on [place], very large cysts L > R. Pt may have progression of CKD and/or vol depletion causing AoCKD.  UA w/ lots of rbcs/ wbcs. Urine lytes c/w advanced CKD. Suspect advanced CKD.  Foley in place. Would continue IVF"s.  Family talking w/ primary team and family are generally requesting against aggressive interventions.  Pt is DNR. Would likely be a poor candidate for  dialysis also. Will follow.  2. HTN - by history, not on any BP lowering meds 3. H/o gout       Matthew Winters 09/08/2020, 1:35 PM   Recent Labs  Lab 09/08/2020 1009 09/09/2020 2201 09/08/20 0044 09/08/20 0918  K 5.6*   < > 5.6* 5.8*  BUN >130*   < > 149* 146*  CREATININE 9.20*   < > 7.39* 7.16*  CALCIUM  --    < > 8.3* 8.1*  HGB 16.0  --  12.9*  --    < > = values in this interval not displayed.   Inpatient medications: . Chlorhexidine Gluconate Cloth  6 each Topical Daily  . heparin  5,000 Units Subcutaneous Q8H   . sodium chloride    . sodium chloride 125 mL/hr at 09/08/20 1200  . cefTRIAXone (ROCEPHIN)  IV 2 g (09/06/2020 2150)   acetaminophen **OR** acetaminophen

## 2020-09-08 NOTE — Progress Notes (Signed)
Phone call to [facility]-completed referral for [facility]. Patient placed on waiting list.   Elliot Gurney, LCSW Transition of Care [phone]

## 2020-09-08 NOTE — Progress Notes (Addendum)
Family Medicine Teaching Service Daily Progress Note Intern Pager: [phone]  Patient name: Matthew Winters Medical record number: [id number] Date of birth: [birth year] Age: 84 y.o. Gender: male  Primary Care Provider: [name], MD Consultants: Palliative Code Status: DNR  Pt Overview and Major Events to Date:  10/9 Admitted, palliative consulted  Assessment and Plan: Matthew Winters is a 84 y.o. male presenting with AMS after a fall on 10/8. PMH is significant for dementia  AMS s/p fall. Meeting SIRS criteria.  Afebrile overnight. GCS scale improved to 9>7>5. Will treat as sepsis for now for presumed sacral wound with fluids and abx while we wait for family to have goals of care discussion with palliative care @ 2-230pm today. CXR w/ atelectasis but cannot rule out infection.  - vitals per routine - mIVF 125 mL/hr.  S/p 3.5 mL IVF bolus.   - CTX  - bld cx pending - am CMP - consider discontinuing cardiac monitoring - palliative care consult   Acute kidney failure  Cr 9.2>8.8>7.39. History of previous kidney disease unknown. AKI likely secondary to severe dehydration and rhabdomyolysis. Anion gap metabolic acidosis likely due to kidney failure. Pt is oliguric 2.1L UOP overnight. Renal US suggestive of medical renal disease. Nephro following not a good candidate for dialysis.  - con't to monitor - mIVF 116ml/hr NS. S/p 3.5L IVF bolus.  - am CMP  Hyperkalemia PM CMP K 5.8>5.6. Secondary to renal failure.  Unable to give oral K binding agents such as lokelma or kayexolate given his mental state.   - F/u AM CMP - consider calcium gluconate for cardioprotection if elevated.  AG metabolic acidosis Due to presumed ketoacidosis secondary to dehydration and lactic acidosis.  CMP w/ continued AG metabolic acidosis   - morning cmp  Acute transaminitis, improving. AST 113>76, ALT 143>110. PMH unknown. - am CMP  Concern for acute infarct  CT head showed hyperdense vessel in L MCA,  chronic diffuse atrophy, small vessel ischemic change. Discussed with family that in the context of poor prognosis and being out of the therapeutic window for thrombolysis an MRI would not change management.  Family agreed to forego MRI.  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  Followed by Dr. Venetia Maxon at [place] medication include combigan, rhopressa, and restasis, and lumigan eye drops   FEN/GI: NPO Prophylaxis: None  Status is: Inpatient  Remains inpatient appropriate because:Altered mental status   Dispo: The patient is from: Unsure              Anticipated d/c is to: Pending              Anticipated d/c date is: 2 days              Patient currently is not medically stable to d/c.  Subjective:  No acute events overnight.  Objective: Temp:  [92.9 F (33.8 C)-99 F (37.2 C)] 98.3 F (36.8 C) (10/09 2340) Pulse Rate:  [50-101] 82 (10/09 2340) Resp:  [15-25] 17 (10/09 2340) BP: (98-153)/(65-91) 145/76 (10/09 2340) SpO2:  [94 %-100 %] 97 % (10/09 2340) Weight:  [80 kg] 80 kg (10/09 0945)  Physical Exam: General: No acute distress. Age appropriate.  Cardiac:  RRR, normal heart sounds, no murmurs Respiratory: CTAB, normal effort Abdomen: soft, nontender, nondistended Extremities: No edema or cyanosis. Skin: Warm and dry, no visible rashes noted (although presumed sacral wound) Neuro: alert. Blinking spontaneously but not on command. Withdrawals and localized painful stimuli.   Laboratory: Recent Labs  Lab 08/31/2020 0954 09/17/2020 1009 09/08/20 0044  WBC 11.8*  --  12.2*  HGB 14.5 16.0 12.9*  HCT 44.3 47.0 40.1  PLT 137*  --  112*   Recent Labs  Lab  09/26/2020 0954 09/12/2020 0954 09/12/2020 1009 09/27/2020 2201 09/08/20 0044  NA 147*   < > 144 147* 148*  K 5.9*   < > 5.6* 5.8* 5.6*  CL 107   < > 114* 116* 115*  CO2 14*  --   --  12* 12*  BUN 149*   < > >130* 150* 149*  CREATININE 8.80*   < > 9.20* 7.55* 7.39*  CALCIUM 9.9  --   --  8.1* 8.3*  PROT 7.5  --   --   --  6.6  BILITOT 1.7*  --   --   --  1.1  ALKPHOS 52  --   --   --  43  ALT 143*  --   --   --  110*  AST 113*  --   --   --  76*  GLUCOSE 145*   < > 144* 117* 124*   < > = values in this interval not displayed.    Imaging/Diagnostic Tests: RENAL / URINARY TRACT ULTRASOUND COMPLETE  COMPARISON:  07/11/2018  IMPRESSION: 1. No hydronephrosis. 2. Echogenic kidneys bilaterally which can be seen in patients with medical renal disease. 3. Bilateral renal cysts measuring up to 10.9 cm on the left.   Electronically Signed   By: Constance Holster M.D.   On: 09/29/2020 21:26  Gerlene Fee, DO 09/08/2020, 12:18 AM PGY-2, [place] Intern pager: [phone], text pages welcome

## 2020-09-09 DIAGNOSIS — I639 Cerebral infarction, unspecified: Secondary | ICD-10-CM | POA: Diagnosis not present

## 2020-09-09 DIAGNOSIS — N179 Acute kidney failure, unspecified: Secondary | ICD-10-CM | POA: Diagnosis not present

## 2020-09-09 DIAGNOSIS — N183 Chronic kidney disease, stage 3 unspecified: Secondary | ICD-10-CM | POA: Diagnosis not present

## 2020-09-09 DIAGNOSIS — Z66 Do not resuscitate: Secondary | ICD-10-CM | POA: Diagnosis not present

## 2020-09-09 NOTE — Progress Notes (Signed)
[facility] ([facility]) Hospital Liaison note.     Received request from TOC manager for family interest in [facility]. [facility] is unable to offer a room today. Hospital Liaison will follow up tomorrow or sooner if a room becomes available.     A Please do not hesitate to call with questions.     Thank you,    Mary Anne Robertson, RN, CCM       [facility] Hospital Liaison (listed on AMION under Hospice /[facility])     [phone]  

## 2020-09-09 NOTE — Progress Notes (Addendum)
Family Medicine Teaching Service Daily Progress Note Intern Pager: [phone]  Patient name: Reginold Beale Medical record number: [id number] Date of birth: December 08, 1935 Age: 84 y.o. Gender: male  Primary Care Provider: [name], MD Consultants: Palliative Care, Nephrology Code Status: DNR  Pt Overview and Major Events to Date:  10/9 Admitted, palliative consulted  Assessment and Plan:  Mr. Eckstein an 51 yr oldmalepresenting with AMS after a fall on 10/8. PMHx is significant fordementia   AMS s/p fall. Meeting SIRS criteria: Has transitioned to comfort care and so no further workup will be done.   Acute kidney failure: Has transitioned to comfort care and so no further labs will be drawn.   Hyperkalemia: Has transitioned to comfort care and so no further labs will be drawn.   AG metabolic acidosis: Has transitioned to comfort care and so no further labs will be drawn.   Acute transaminitis, improving: Has transitioned to comfort care and so no further labs will be drawn.   Acute Infarct: Has transitioned to comfort care and so no further workup will be done.    Cataracts and Glaucoma: Has transitioned to comfort care and so no further workup will be done.   FEN/GI: NPO PPx: None  Disposition: Med-Surg  Prior to Admission Living Arrangement: Home Anticipated Discharge Location: [facility] Barriers to Discharge: Bed availability Anticipated discharge in approximately 0-2 day(s).   Subjective:  Interviewed patient at bedside.  Patient was sleeping comfortably during exam. Did not awaken patient. He is on comfort care and appears to be comfortable.  Objective: Temp:  [97.6 F (36.4 C)-98.4 F (36.9 C)] 98.4 F (36.9 C) (10/10 2042) Pulse Rate:  [80-106] 88 (10/10 2042) Resp:  [15-20] 15 (10/10 2042) BP: (113-129)/(90-91) 129/91 (10/10 2042) SpO2:  [69 %-100 %] 98 % (10/10 2042) Physical Exam:  Physical Exam Vitals and nursing note reviewed.   Constitutional:      General: He is not in acute distress.    Appearance: He is not ill-appearing, toxic-appearing or diaphoretic.  HENT:     Head: Normocephalic and atraumatic.  Cardiovascular:     Rate and Rhythm: Normal rate and regular rhythm.     Pulses: Normal pulses.          Radial pulses are 2+ on the right side and 2+ on the left side.       Dorsalis pedis pulses are 2+ on the right side and 2+ on the left side.     Heart sounds: Normal heart sounds, S1 normal and S2 normal. No murmur heard.   Pulmonary:     Effort: Pulmonary effort is normal. No respiratory distress.     Breath sounds: Normal breath sounds. No wheezing.  Musculoskeletal:     Right lower leg: No edema.     Left lower leg: No edema.      Laboratory: Recent Labs  Lab 09/14/2020 0954 09/19/2020 1009 09/08/20 0044  WBC 11.8*  --  12.2*  HGB 14.5 16.0 12.9*  HCT 44.3 47.0 40.1  PLT 137*  --  112*   Recent Labs  Lab 09/01/2020 0954 09/03/2020 1009 08/31/2020 2201 09/08/20 0044 09/08/20 0918  NA 147*   < > 147* 148* 149*  K 5.9*   < > 5.8* 5.6* 5.8*  CL 107   < > 116* 115* 117*  CO2 14*   < > 12* 12* 10*  BUN 149*   < > 150* 149* 146*  CREATININE 8.80*   < > 7.55* 7.39* 7.16*  CALCIUM 9.9   < > 8.1* 8.3* 8.1*  PROT 7.5  --   --  6.6 6.6  BILITOT 1.7*  --   --  1.1 1.0  ALKPHOS 52  --   --  43 47  ALT 143*  --   --  110* 102*  AST 113*  --   --  76* 61*  GLUCOSE 145*   < > 117* 124* 118*   < > = values in this interval not displayed.      Imaging/Diagnostic Tests:   US Renal: No hydronephrosis. Echogenic kidneys bilaterally which can be seen in patients with medical renal disease. Bilateral renal cysts measuring up to 10.9 cm on the left.   DG Pelvis Portable: No acute osseous abnormality.   DG Chest Port 1 View: Low lung volume exam with bibasilar opacities favored to represent atelectasis. Infection not excluded.   CT Head WO Contrast: see results below   CT Cervical Spine Wo  Contrast: Question hyperdense vessel in the left middle cerebral artery. Recommend MRI of the head for further evaluation to exclude acute infarct. No focal acute fracture or dislocation of cervical spine. Chronic diffuse atrophy and chronic bilateral periventricular white matter small vessel ischemic change.   Briant Cedar, MD 09/09/2020, 6:37 AM PGY-1, [place] Intern pager: [phone], text pages welcome

## 2020-09-09 NOTE — Progress Notes (Signed)
Note transition to hospice / comfort. Renal will sign off. RS

## 2020-09-10 DIAGNOSIS — N19 Unspecified kidney failure: Secondary | ICD-10-CM | POA: Diagnosis not present

## 2020-09-10 DIAGNOSIS — Z515 Encounter for palliative care: Secondary | ICD-10-CM | POA: Diagnosis not present

## 2020-09-10 DIAGNOSIS — N179 Acute kidney failure, unspecified: Secondary | ICD-10-CM | POA: Diagnosis not present

## 2020-09-10 DIAGNOSIS — Z789 Other specified health status: Secondary | ICD-10-CM

## 2020-09-10 DIAGNOSIS — Z66 Do not resuscitate: Secondary | ICD-10-CM | POA: Diagnosis not present

## 2020-09-10 DIAGNOSIS — E860 Dehydration: Secondary | ICD-10-CM | POA: Diagnosis not present

## 2020-09-10 MED ORDER — OXYCODONE HCL 20 MG/ML PO CONC
10.0000 mg | ORAL | Status: AC
  Administered 2020-09-10: 10 mg via SUBLINGUAL
  Filled 2020-09-10: qty 1

## 2020-09-10 MED ORDER — OXYCODONE HCL 5 MG/5ML PO SOLN
5.0000 mg | ORAL | Status: DC
  Administered 2020-09-10 (×2): 5 mg via ORAL
  Filled 2020-09-10 (×4): qty 5

## 2020-09-10 MED ORDER — OXYCODONE HCL 5 MG/5ML PO SOLN: 5.0000 mg | ORAL | Status: DC

## 2020-09-12 LAB — CULTURE, BLOOD (ROUTINE X 2)
Culture: NO GROWTH
Culture: NO GROWTH
Special Requests: ADEQUATE
Special Requests: ADEQUATE

## 2020-09-19 ENCOUNTER — Telehealth: Payer: Self-pay | Admitting: Family Medicine

## 2020-09-19 NOTE — Telephone Encounter (Signed)
Death Certificate was dropped off by [facility] and has been placed in PCP's box for completion. Please use blue or black ink. Please return to [facility] or [facility] once completed.   Death Certificate was brought back in lady stated it was not done on the correct type of paper, so she is needing it resigned. [place] was the provider that seen patient in the hospital will place it in her box. Thanks

## 2020-09-22 NOTE — Telephone Encounter (Signed)
Forms completed and placed in front box. Mistake was made on form provided thus had to redo on alternative form received from hospital staff.

## 2020-09-24 ENCOUNTER — Telehealth: Payer: Self-pay | Admitting: Internal Medicine

## 2020-09-24 NOTE — Telephone Encounter (Signed)
Called funeral home to pick up DC; [employer]

## 2020-09-24 NOTE — Telephone Encounter (Signed)
Death certificate given to [facility] per note.

## 2020-09-30 NOTE — Progress Notes (Signed)
Engineer, maintenance [facility]) Hospital Liaison note.     [facility] is unable to offer a bed today.  Hospital Liaison will follow up tomorrow or sooner if a room becomes available.     A Please do not hesitate to call with questions.     Thank you,    Farrel Gordon, RN, [facility]       [place] (listed on AMION under [place])     [phone]

## 2020-09-30 NOTE — Progress Notes (Signed)
DECEASED NOTE:  ASSESSMENT: on assessment patient had no respirations or heart sounds. Assessed patient pulse, pulseless. Patient cold to touch.  CONFIRMATION: Britta Mccreedy, RN / Rockie Neighbours, RN  TIME OF DEATH:  22:58  DATE OF DEATH: Sep 14, 2020   DONOR SERVICES: Called in. Spoke with [employer]. Reference no. [id number]  ATTENDING PHYSICIAN: notified  FAMILY: notified. Will come to see patient  MORGUE:notified  CHAPLAIN: no  PATIENT PLACEMENT: notified

## 2020-09-30 NOTE — Progress Notes (Signed)
Family Medicine Teaching Service Daily Progress Note Intern Pager: [phone]  Patient name: Matthew Winters Medical record number: [id number] Date of birth: 1936-03-15 Age: 84 y.o. Gender: male  Primary Care Provider: [name], MD Consultants:  Palliative Care, Nephrology Code Status: DNR  Pt Overview and Major Events to Date:  10/9 Admitted, palliative consulted 10/10 Transitioned to [place] and Plan:  Mr. Paolucci an 40 yr oldmalepresenting with AMS after a fall on 10/8. PMHx is significant fordementia   AMS s/p fall. Meeting SIRS criteria: Has transitioned to comfort care and so no further workup will be done.   Acute kidney failure:Has transitioned to comfort care and so no further labs will be drawn.   Hyperkalemia: Has transitioned to comfort care and so no further labs will be drawn.   AG metabolic acidosis: Has transitioned to comfort care and so no further labs will be drawn.   Acute transaminitis, improving: Has transitioned to comfort care and so no further labs will be drawn.   Acute Infarct: Has transitioned to comfort care and so no further workup will be done.    Cataracts and Glaucoma: Has transitioned to comfort care and so no further workup will be done.   FEN/GI: Comfort Feeds PPx: None  Disposition: Med-Surg  Prior to Admission Living Arrangement: Home Anticipated Discharge Location: [facility] Barriers to Discharge: Bed Availability  Anticipated discharge in approximately 0-2 day(s).   Subjective:  Interviewed patient at bedside.  Patient was resting while interviewed. Had a slight rattle in his breathing but air was still moving well into lungs. He appeared comfortable.  Objective: Temp:  [97.9 F (36.6 C)] 97.9 F (36.6 C) (10/11 2026) Pulse Rate:  [87-96] 96 (10/11 2026) Resp:  [20] 20 (10/11 2026) BP: (111-115)/(78-84) 111/78 (10/11 2026) SpO2:  [93 %-100 %] 93 % (10/11 2026) Physical Exam:   Physical Exam Vitals and nursing note reviewed.  Constitutional:      General: He is not in acute distress.    Appearance: Normal appearance. He is not ill-appearing, toxic-appearing or diaphoretic.  HENT:     Head: Normocephalic and atraumatic.  Cardiovascular:     Rate and Rhythm: Normal rate and regular rhythm.     Pulses: Normal pulses.          Radial pulses are 2+ on the right side and 2+ on the left side.       Dorsalis pedis pulses are 2+ on the right side and 2+ on the left side.     Heart sounds: Normal heart sounds, S1 normal and S2 normal. No murmur heard.   Pulmonary:     Effort: Pulmonary effort is normal. No respiratory distress.     Breath sounds: No wheezing.  Abdominal:     General: There is no distension.     Palpations: There is no mass.     Tenderness: There is abdominal tenderness (mild groan to palpation). There is no guarding.  Musculoskeletal:     Right lower leg: No edema.     Left lower leg: No edema.  Neurological:     Mental Status: He is alert.      Laboratory: Recent Labs  Lab  0954 09/20/2020 1009 09/08/20 0044  WBC 11.8*  --  12.2*  HGB 14.5 16.0 12.9*  HCT 44.3 47.0 40.1  PLT 137*  --  112*   Recent Labs  Lab 09/01/2020 0954 09/02/2020 1009 09/02/2020 2201 09/08/20 0044 09/08/20 0918  NA 147*   < >  147* 148* 149*  K 5.9*   < > 5.8* 5.6* 5.8*  CL 107   < > 116* 115* 117*  CO2 14*   < > 12* 12* 10*  BUN 149*   < > 150* 149* 146*  CREATININE 8.80*   < > 7.55* 7.39* 7.16*  CALCIUM 9.9   < > 8.1* 8.3* 8.1*  PROT 7.5  --   --  6.6 6.6  BILITOT 1.7*  --   --  1.1 1.0  ALKPHOS 52  --   --  43 47  ALT 143*  --   --  110* 102*  AST 113*  --   --  76* 61*  GLUCOSE 145*   < > 117* 124* 118*   < > = values in this interval not displayed.      Imaging/Diagnostic Tests:   US Renal: No hydronephrosis. Echogenic kidneys bilaterally which can be seen in patients with medical renal disease. Bilateral renal cysts measuring up to 10.9  cm on the left.   DG Pelvis Portable: No acute osseous abnormality.   DG Chest Port 1 View: Low lung volume exam with bibasilar opacities favored to represent atelectasis. Infection not excluded.   CT Head WO Contrast: see results below   CT Cervical Spine Wo Contrast: Question hyperdense vessel in the left middle cerebral artery. Recommend MRI of the head for further evaluation to exclude acute infarct. No focal acute fracture or dislocation of cervical spine. Chronic diffuse atrophy and chronic bilateral periventricular white matter small vessel ischemic change.   Briant Cedar, MD October 04, 2020, 6:08 AM PGY-1, [place] Intern pager: [phone], text pages welcome

## 2020-09-30 NOTE — Death Summary Note (Signed)
[facility] Death Summary  Patient name: Matthew Winters Medical record number: [id number] Date of birth: 10-04-1936 Age: 84 y.o. Gender: male Date of Admission: September 30, 2020  Date of Death: 10-03-20 Admitting Physician: Shary Key, DO  Primary Care Provider: [name], MD Consultants: Palliative Care, Nephrology  Indication for Hospitalization: Altered mental status  Discharge Diagnoses/Problem List:  Altered mental status Acute kidney failure Acute liver injury Frequent falls Advanced dementia  Disposition: Death  Brief Hospital Course:  Matthew Winters is a 84 y.o. male with past medical history of severe dementia with progressive decline over last 2 months who presented to [facility] ED after being found down after a fall. He presented meeting Sepsis criteria (hypothermia and leukocytosis, unclear source of infection) and a GCS of 5. He had CT head and spine that was negative for fracture or bleed but notable for chronic diffuse atrophy and ischemic changes as well as a hyperdense vessel in left MCA concerning for acute stroke as cause of presentation (outisde TPA window) with recommendation to obtain MRI. Remaining work up included renal ultrasound that noted medical renal disease, hyperkalemia, anuric kidney failure with Cr of 8.8 and BUN of 149, acute liver injury, anion gap metabolic acidosis, and rhabdomyolysis. It was felt patient likely developed acute kidney failure due to severe dehydration and rhabdomyolysis. Nephrology was consulted who recommended aggressive fluid resuscitation but did not feel patient was a candidate for dialysis. He was treated with IV fluids and antibiotics, however family opted against MRI.  Given patient's severe dementia at baseline and concern for multiorgan failure and unconscious state on admission, goals of care discussion was initiated while acute pathologies were treated accordingly. Palliative care was consulted and on  09/08/20 family opted to transition patient to full comfort care. Aggressive treatment measures was discontinued and further management was focused on comfort measures. The patient expired peacefully at 2258 on 2020-10-03.    Significant Procedures: None  Significant Labs and Imaging:  CT Head Wo Contrast  Result Date: 09-30-20 CLINICAL DATA:  Status post multiple falls with intermitted altered mental status. EXAM: CT HEAD WITHOUT CONTRAST CT CERVICAL SPINE WITHOUT CONTRAST TECHNIQUE: Multidetector CT imaging of the head and cervical spine was performed following the standard protocol without intravenous contrast. Multiplanar CT image reconstructions of the cervical spine were also generated. COMPARISON:  August 10th 2019 FINDINGS: CT HEAD FINDINGS Brain: No evidence of acute infarction, hemorrhage, hydrocephalus, extra-axial collection or mass lesion/mass effect. There is chronic diffuse atrophy. Chronic bilateral periventricular white matter small vessel ischemic changes noted. Vascular: There is question hyperdense vessel in the left middle cerebral artery. Skull: Normal. Negative for fracture or focal lesion. Sinuses/Orbits: No acute finding. Other: None. CT CERVICAL SPINE FINDINGS Alignment: There is straightening of cervical spine. Skull base and vertebrae: No acute fracture. No primary bone lesion or focal pathologic process. Soft tissues and spinal canal: No prevertebral fluid or swelling. No visible canal hematoma. Disc levels: Degenerative joint changes are noted throughout the cervical spine with narrowed joint space and osteophyte formation. Upper chest: Emphysematous changes of the lung apices are noted. Other: None. IMPRESSION: 1. Question hyperdense vessel in the left middle cerebral artery. Recommend MRI of the head for further evaluation to exclude acute infarct. 2. No focal acute fracture or dislocation of cervical spine. 3. Chronic diffuse atrophy and chronic bilateral periventricular  white matter small vessel ischemic change. These results were called by telephone at the time of interpretation on Sep 30, 2020 at 11:17 am to provider PETER  [place] , who verbally acknowledged these results. Electronically Signed   By: Abelardo Diesel M.D.   On: 09/27/2020 11:20   CT Cervical Spine Wo Contrast  Result Date: 09/12/2020 CLINICAL DATA:  Status post multiple falls with intermitted altered mental status. EXAM: CT HEAD WITHOUT CONTRAST CT CERVICAL SPINE WITHOUT CONTRAST TECHNIQUE: Multidetector CT imaging of the head and cervical spine was performed following the standard protocol without intravenous contrast. Multiplanar CT image reconstructions of the cervical spine were also generated. COMPARISON:  August 10th 2019 FINDINGS: CT HEAD FINDINGS Brain: No evidence of acute infarction, hemorrhage, hydrocephalus, extra-axial collection or mass lesion/mass effect. There is chronic diffuse atrophy. Chronic bilateral periventricular white matter small vessel ischemic changes noted. Vascular: There is question hyperdense vessel in the left middle cerebral artery. Skull: Normal. Negative for fracture or focal lesion. Sinuses/Orbits: No acute finding. Other: None. CT CERVICAL SPINE FINDINGS Alignment: There is straightening of cervical spine. Skull base and vertebrae: No acute fracture. No primary bone lesion or focal pathologic process. Soft tissues and spinal canal: No prevertebral fluid or swelling. No visible canal hematoma. Disc levels: Degenerative joint changes are noted throughout the cervical spine with narrowed joint space and osteophyte formation. Upper chest: Emphysematous changes of the lung apices are noted. Other: None. IMPRESSION: 1. Question hyperdense vessel in the left middle cerebral artery. Recommend MRI of the head for further evaluation to exclude acute infarct. 2. No focal acute fracture or dislocation of cervical spine. 3. Chronic diffuse atrophy and chronic bilateral periventricular white  matter small vessel ischemic change. These results were called by telephone at the time of interpretation on 09/12/2020 at 11:17 am to provider [facility] , who verbally acknowledged these results. Electronically Signed   By: Abelardo Diesel M.D.   On: 09/06/2020 11:20   US RENAL  Result Date: 08/31/2020 CLINICAL DATA:  Acute renal failure. EXAM: RENAL / URINARY TRACT ULTRASOUND COMPLETE COMPARISON:  07/11/2018 FINDINGS: Right Kidney: Renal measurements: 9.6 x 4.9 x 6.1 cm = volume: 151 mL. Multiple cysts are noted measuring up to approximately 2.6 cm. The cortical echogenicity is slightly increased. Left Kidney: Renal measurements: 15.2 x 7 x 7.2 cm = volume: 404 mL. Multiple large renal cysts are noted measuring up to approximately 10.9 cm. There is increased cortical echogenicity without evidence for hydronephrosis. Bladder: Appears normal for degree of bladder distention. Other: None. IMPRESSION: 1. No hydronephrosis. 2. Echogenic kidneys bilaterally which can be seen in patients with medical renal disease. 3. Bilateral renal cysts measuring up to 10.9 cm on the left. Electronically Signed   By: Constance Holster M.D.   On: 09/20/2020 21:26   DG Pelvis Portable  Result Date:  CLINICAL DATA:  Fall.  Weakness and altered mental status. EXAM: PORTABLE PELVIS 1-2 VIEWS COMPARISON:  None. FINDINGS: Lumbar spine degenerative changes. Multiple pelvic phleboliths. No evidence for displaced fracture. Regional soft tissues unremarkable. IMPRESSION: No acute osseous abnormality. Electronically Signed   By: Lovey Newcomer M.D.   On: 09/29/2020 12:06   DG Chest Port 1 View  Result Date: 09/28/2020 CLINICAL DATA:  Follow-up.  Weakness. EXAM: PORTABLE CHEST 1 VIEW COMPARISON:  Chest radiograph 09/18/2018. FINDINGS: Monitoring leads overlie the patient. Stable cardiac and mediastinal contours. Tortuosity of the thoracic aorta. Low lung volumes. Bibasilar heterogeneous opacities. No pleural effusion or  pneumothorax. IMPRESSION: Low lung volume exam with bibasilar opacities favored to represent atelectasis. Infection not excluded. Electronically Signed   By: Lovey Newcomer M.D.   On: 09/09/2020 12:04   Nickcole Bralley,  Archie Endo, DO Oct 03, 2020, 11:51 PM PGY-3, [facility]

## 2020-09-30 NOTE — Progress Notes (Signed)
Daily Progress Note   Patient Name: Niko Penson       Date: 2020-10-01 DOB: 05-11-1936  Age: 84 y.o. MRN#: [id number] Attending Physician: Martyn Malay, MD Primary Care Physician: [name], MD Admit Date: 09/24/2020  Reason for Consultation/Follow-up: Establishing goals of care  Subjective: Patient in bed- moaning intermittently. Daughter Kieth Brightly is at bedside. No bed available at [facility] today.   Review of Systems  Unable to perform ROS: Mental acuity    Length of Stay: 3  Current Medications: Scheduled Meds:  . Chlorhexidine Gluconate Cloth  6 each Topical Daily  . oxyCODONE  10 mg Sublingual NOW  . oxyCODONE  5 mg Oral Q4H    Continuous Infusions:   PRN Meds: acetaminophen **OR** acetaminophen, antiseptic oral rinse, glycopyrrolate **OR** glycopyrrolate **OR** glycopyrrolate, haloperidol **OR** haloperidol **OR** haloperidol lactate, LORazepam **OR** LORazepam **OR** LORazepam, ondansetron **OR** ondansetron (ZOFRAN) IV, oxyCODONE **OR** oxyCODONE, polyvinyl alcohol  Physical Exam Vitals and nursing note reviewed.  Constitutional:      Comments: cachetic  Pulmonary:     Comments: Increased rate and effort Skin:    General: Skin is warm and dry.             Vital Signs: BP 102/66 (BP Location: Right Arm)   Pulse 98   Temp 97.9 F (36.6 C)   Resp 18   Ht 5\' 6"  (1.676 m)   Wt 55.8 kg   SpO2 93%   BMI 19.86 kg/m  SpO2: SpO2: 93 % O2 Device: O2 Device: Room Air O2 Flow Rate: O2 Flow Rate (L/min): 2 L/min  Intake/output summary:   Intake/Output Summary (Last 24 hours) at 10-01-20 1333 Last data filed at 10-01-2020 0900 Gross per 24 hour  Intake 0 ml  Output 650 ml  Net -650 ml   LBM: Last BM Date:  (PTA) Baseline Weight: Weight: 80 kg Most recent  weight: Weight: 55.8 kg       Palliative Assessment/Data: PPS: 10%     Patient Active Problem List   Diagnosis Date Noted  . Acute renal failure superimposed on stage 3 chronic kidney disease ([place])   . Palliative care by specialist   . Generalized weakness   . DNR (do not resuscitate)   . DNI (do not intubate)   . Sepsis ([place]) 09/05/2020  . Abnormal liver enzymes   .  Loss of appetite   . AKI (acute kidney injury) ([place]) 07/09/2018  . Dehydration 07/09/2018  . Adynamic ileus ([place]) 07/09/2018  . Essential hypertension 07/09/2018  . CKD (chronic kidney disease), stage III ([place]) 07/09/2018  . Acute urinary retention 07/09/2018    Palliative Care Assessment & Plan   Patient Profile: 84 y.o.malewith past medical history of hypertension, hyperlipidemia, and advanced dementia was evaluated in the ED on 08/30/2020 for AMS and multiple falls at home. Patient's history is significant for gradually worsening progressive symptoms of dementia. Admitting providers were consulted on 09/25/2020 for AMS s/p fall, AKI, acute transaminitis, and concern for acute infarct.  Assessment/Recommendations/Plan   Await[facility]  Schedule oxycodone solution 5mg  q4hr  Oxycodone solution 10mg  now  Goals of Care and Additional Recommendations:  Limitations on Scope of Treatment: Full Comfort Care  Code Status:  DNR  Prognosis:   < 2 weeks  Discharge Planning:  Hospice facility  Care plan was discussed with daughter Kieth Brightly.  Thank you for allowing the Palliative Medicine Team to assist in the care of this patient.   Total time: 28 mins Greater than 50%  of this time was spent counseling and coordinating care related to the above assessment and plan.  Mariana Kaufman, AGNP-C Palliative Medicine   Please contact Palliative Medicine Team phone at [phone] for questions and concerns.

## 2020-09-30 DEATH — deceased

## 2020-11-17 DIAGNOSIS — Z7189 Other specified counseling: Secondary | ICD-10-CM

## 2021-11-01 IMAGING — CT CT CERVICAL SPINE W/O CM
3 of 4 series · 13 of 33 positions shown, 16 images · non-contrast
Comparison: [REDACTED] 5914

CLINICAL DATA: Status post multiple falls with intermitted altered
mental status.

EXAM:
CT HEAD WITHOUT CONTRAST
CT CERVICAL SPINE WITHOUT CONTRAST
TECHNIQUE: Multidetector CT imaging of the head and cervical spine was
performed following the standard protocol without intravenous
contrast. Multiplanar CT image reconstructions of the cervical spine
were also generated.

[Series 4: c_spine 2.0 st · axial · 0.31mm/px · z∈[-178,-50]mm · 5 of 97 slices shown, 7 images]
[im 17/97  soft-tissue]
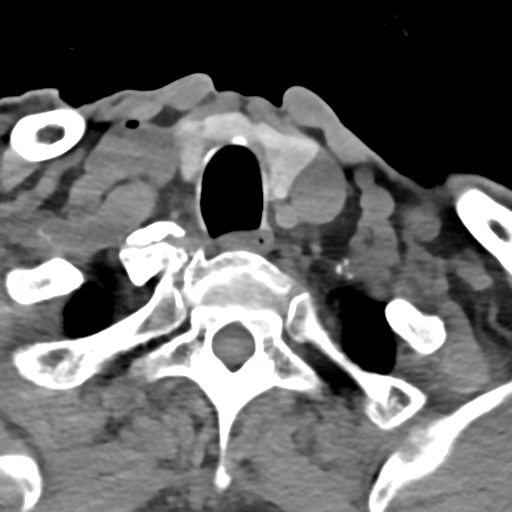
[im 17/97  bone]
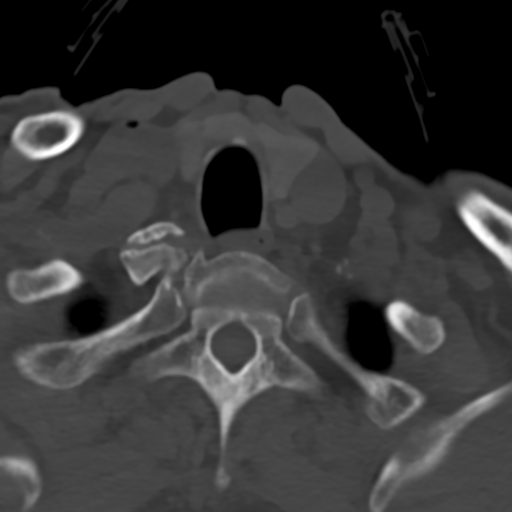
[im 33/97  bone]
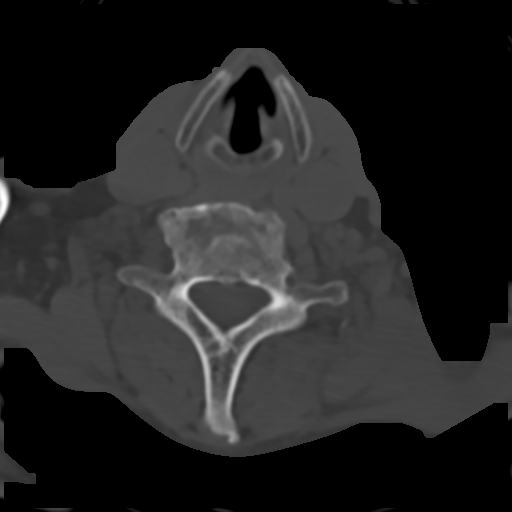
[im 49/97  bone]
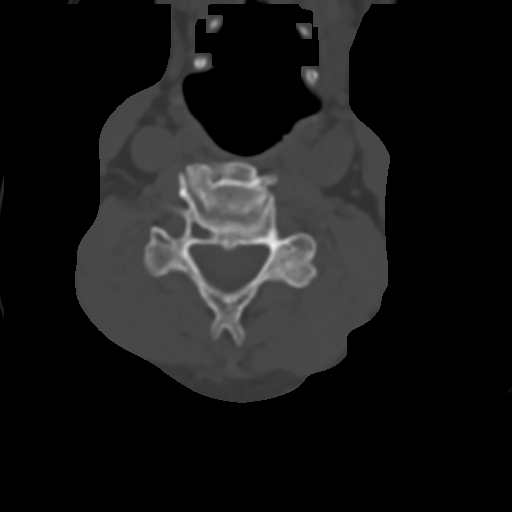
[im 65/97  bone]
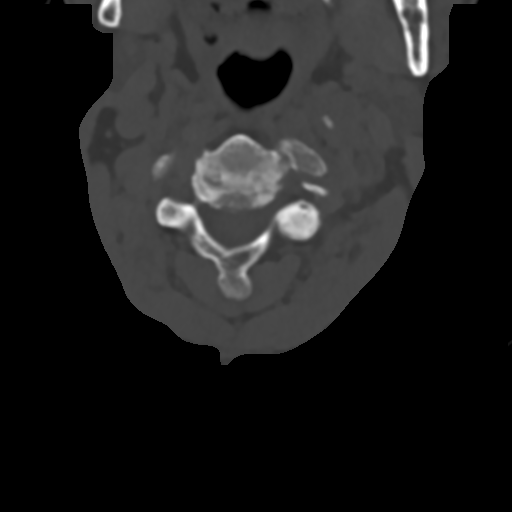
[im 81/97  soft-tissue]
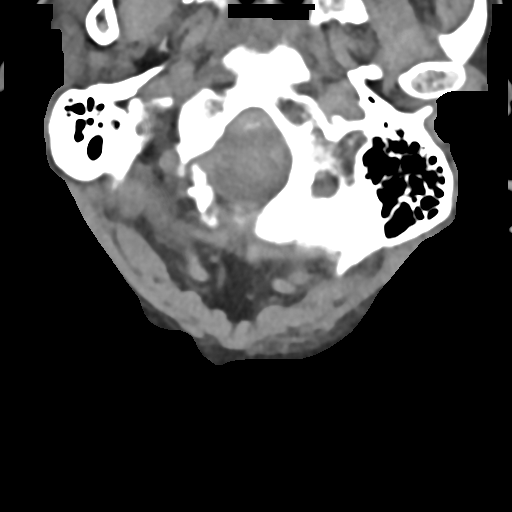
[im 81/97  bone]
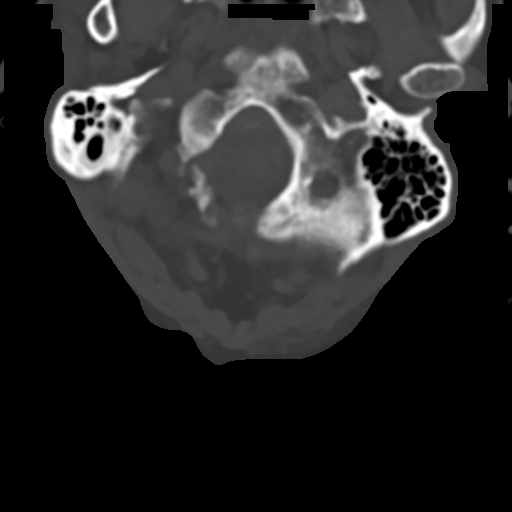

[Series 6: c_spine 2.0 sag bone · sagittal · 0.37mm/px · 5 of 51 slices shown, 6 images]
[im 17/51  bone]
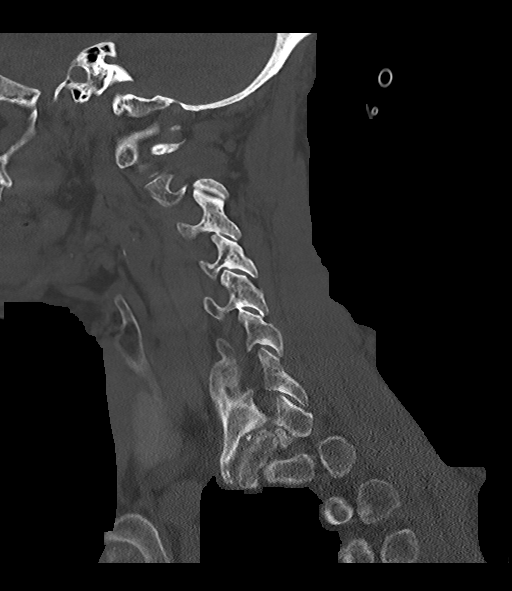
[im 21/51  bone]
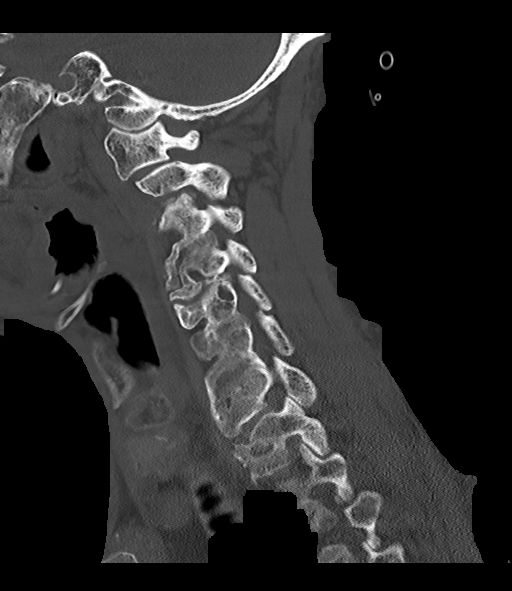
[im 26/51  soft-tissue]
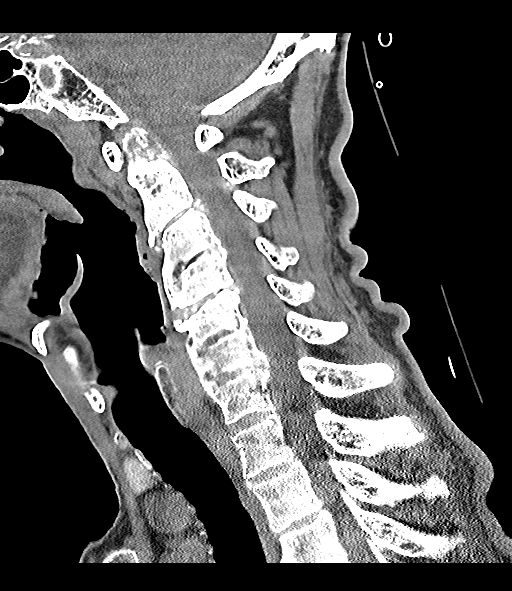
[im 26/51  bone]
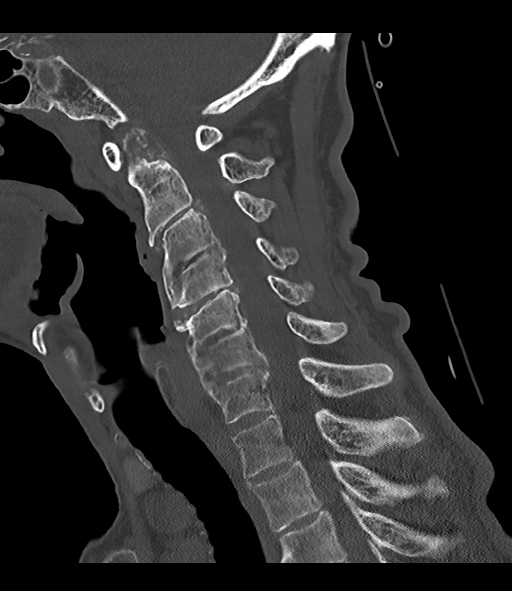
[im 30/51  bone]
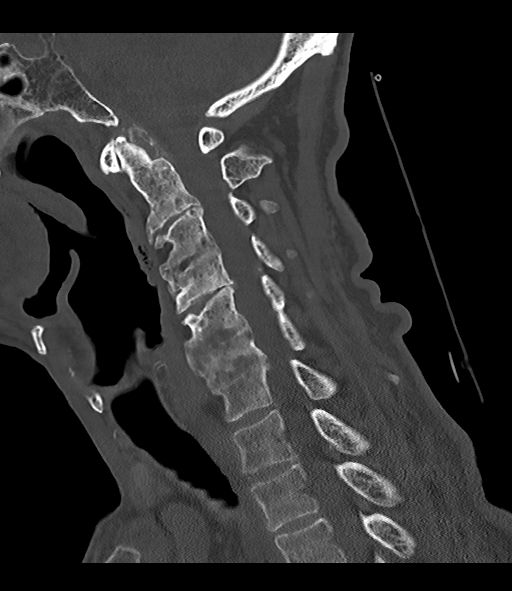
[im 34/51  bone]
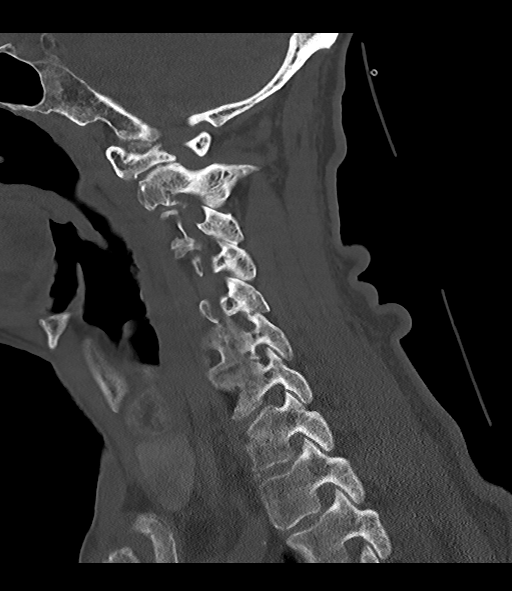

[Series 7: c_spine 2.0 cor bone · coronal · 0.33mm/px · 3 of 63 slices shown]
[im 13/63  bone]
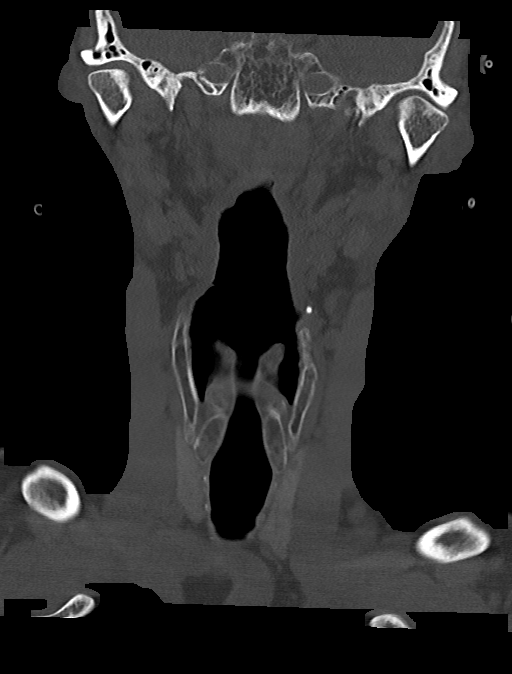
[im 25/63  bone]
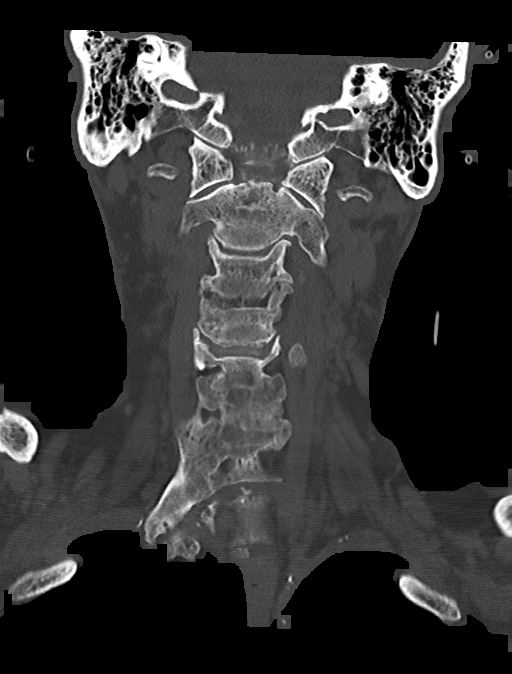
[im 38/63  bone]
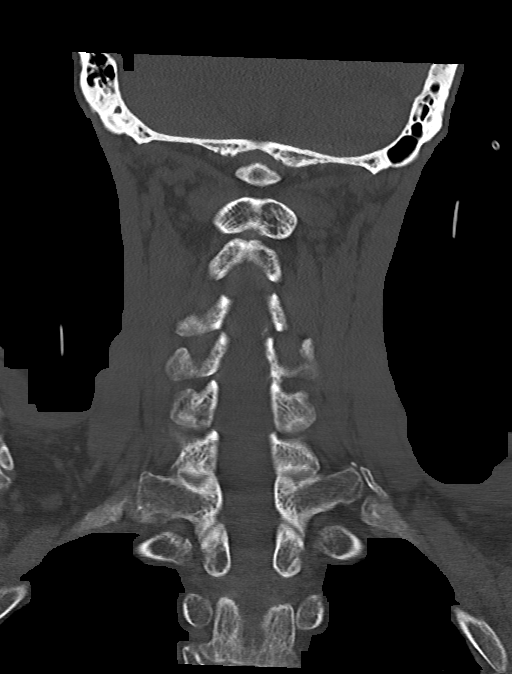

[13 of 33 positions shown; findings below may reference images not displayed]

FINDINGS: CT HEAD FINDINGS

Brain: No evidence of acute infarction, hemorrhage, hydrocephalus,
extra-axial collection or mass lesion/mass effect. There is chronic
diffuse atrophy. Chronic bilateral periventricular white matter
small vessel ischemic changes noted.

Vascular: There is question hyperdense vessel in the left middle
cerebral artery.

Skull: Normal. Negative for fracture or focal lesion.

Sinuses/Orbits: No acute finding.

Other: None.

CT CERVICAL SPINE FINDINGS

Alignment: There is straightening of cervical spine.

Skull base and vertebrae: No acute fracture. No primary bone lesion
or focal pathologic process.

Soft tissues and spinal canal: No prevertebral fluid or swelling. No
visible canal hematoma.

Disc levels: Degenerative joint changes are noted throughout the
cervical spine with narrowed joint space and osteophyte formation.

Upper chest: Emphysematous changes of the lung apices are noted.

Other: None.
IMPRESSION: 1. Question hyperdense vessel in the left middle cerebral artery.
Recommend MRI of the head for further evaluation to exclude acute
infarct.
2. No focal acute fracture or dislocation of cervical spine.
3. Chronic diffuse atrophy and chronic bilateral periventricular
white matter small vessel ischemic change.

These results were called by telephone at the time of interpretation
on [DATE] at 09/07/2020 to provider KLPIGBB MOOLMAN , who verbally
acknowledged these results.

## 2021-11-01 IMAGING — DX DG PORTABLE PELVIS
1 series · 1 of 1 positions shown · non-contrast
Comparison: None.

CLINICAL DATA: Fall.  Weakness and altered mental status.

EXAM:
PORTABLE PELVIS 1-2 VIEWS

[pelvis]
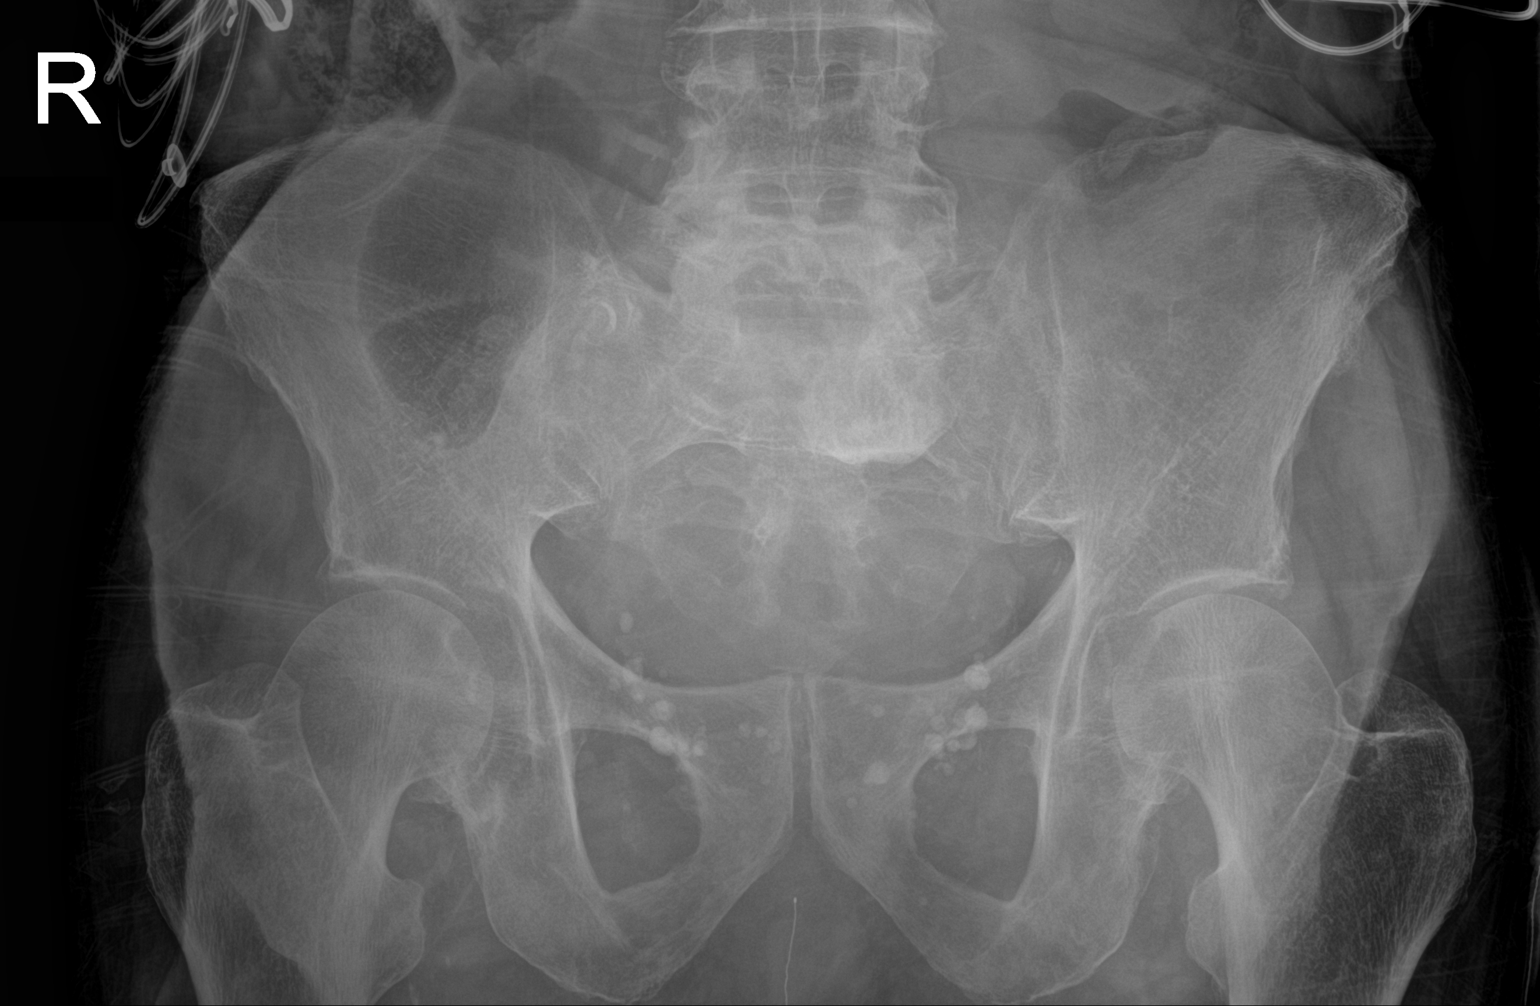

[1 of 1 positions shown; findings below may reference images not displayed]

FINDINGS: Lumbar spine degenerative changes. Multiple pelvic phleboliths. No
evidence for displaced fracture. Regional soft tissues unremarkable.
IMPRESSION: No acute osseous abnormality.

## 2024-02-23 NOTE — Telephone Encounter (Signed)
error
